# Patient Record
Sex: Male | Born: 1960
Health system: Southern US, Community
[De-identification: ages and names within clinical notes are randomized; demographics above are authoritative.]

## PROBLEM LIST (undated history)

## (undated) DIAGNOSIS — M1990 Unspecified osteoarthritis, unspecified site: Secondary | ICD-10-CM

## (undated) DIAGNOSIS — F419 Anxiety disorder, unspecified: Secondary | ICD-10-CM

## (undated) DIAGNOSIS — T7840XA Allergy, unspecified, initial encounter: Secondary | ICD-10-CM

## (undated) DIAGNOSIS — K219 Gastro-esophageal reflux disease without esophagitis: Secondary | ICD-10-CM

## (undated) DIAGNOSIS — E785 Hyperlipidemia, unspecified: Secondary | ICD-10-CM

## (undated) HISTORY — DX: Unspecified osteoarthritis, unspecified site: M19.90

## (undated) HISTORY — DX: Anxiety disorder, unspecified: F41.9

## (undated) HISTORY — DX: Hyperlipidemia, unspecified: E78.5

## (undated) HISTORY — DX: Gastro-esophageal reflux disease without esophagitis: K21.9

## (undated) HISTORY — PX: POLYPECTOMY: SHX149

## (undated) HISTORY — DX: Allergy, unspecified, initial encounter: T78.40XA

---

## 2000-11-11 HISTORY — PX: COLONOSCOPY: SHX174

## 2004-09-21 ENCOUNTER — Ambulatory Visit: Payer: Self-pay | Admitting: Internal Medicine

## 2004-10-25 ENCOUNTER — Ambulatory Visit: Payer: Self-pay | Admitting: Internal Medicine

## 2004-11-01 ENCOUNTER — Ambulatory Visit: Payer: Self-pay | Admitting: Internal Medicine

## 2005-06-18 ENCOUNTER — Ambulatory Visit: Payer: Self-pay | Admitting: Internal Medicine

## 2005-08-08 ENCOUNTER — Ambulatory Visit: Payer: Self-pay | Admitting: Internal Medicine

## 2005-09-06 ENCOUNTER — Ambulatory Visit: Payer: Self-pay | Admitting: Internal Medicine

## 2005-11-23 ENCOUNTER — Emergency Department (HOSPITAL_COMMUNITY): Admission: EM | Admit: 2005-11-23 | Discharge: 2005-11-23 | Payer: Self-pay | Admitting: Emergency Medicine

## 2005-12-02 ENCOUNTER — Encounter (INDEPENDENT_AMBULATORY_CARE_PROVIDER_SITE_OTHER): Payer: Self-pay | Admitting: *Deleted

## 2005-12-02 ENCOUNTER — Ambulatory Visit: Payer: Self-pay | Admitting: Internal Medicine

## 2005-12-26 ENCOUNTER — Ambulatory Visit: Payer: Self-pay | Admitting: Internal Medicine

## 2006-08-18 ENCOUNTER — Ambulatory Visit: Payer: Self-pay | Admitting: Internal Medicine

## 2006-12-03 ENCOUNTER — Ambulatory Visit: Payer: Self-pay | Admitting: Internal Medicine

## 2006-12-03 ENCOUNTER — Encounter (INDEPENDENT_AMBULATORY_CARE_PROVIDER_SITE_OTHER): Payer: Self-pay | Admitting: *Deleted

## 2006-12-03 LAB — CONVERTED CEMR LAB
Alkaline Phosphatase: 49 units/L (ref 39–117)
BUN: 18 mg/dL (ref 6–23)
Basophils Relative: 0.6 % (ref 0.0–1.0)
CO2: 31 meq/L (ref 19–32)
Calcium: 9.2 mg/dL (ref 8.4–10.5)
Cholesterol: 146 mg/dL (ref 0–200)
HDL: 46.1 mg/dL (ref >39.0)
Hemoglobin: 14.9 g/dL (ref 13.0–17.0)
Lymphocytes Relative: 25.8 % (ref 12.0–46.0)
Monocytes Relative: 10.1 % (ref 3.0–11.0)
Neutrophils Relative %: 54.7 % (ref 43.0–77.0)
PSA: 0.86 ng/mL (ref 0.10–4.00)
Potassium: 4 meq/L (ref 3.5–5.1)
TSH: 2.8 microintl units/mL (ref 0.35–5.50)
Total CHOL/HDL Ratio: 3.2
Total Protein: 6.5 g/dL (ref 6.0–8.3)
WBC: 4.1 10*3/uL — ABNORMAL LOW (ref 4.5–10.5)

## 2006-12-11 DIAGNOSIS — IMO0002 Reserved for concepts with insufficient information to code with codable children: Secondary | ICD-10-CM | POA: Insufficient documentation

## 2007-03-05 ENCOUNTER — Ambulatory Visit: Payer: Self-pay | Admitting: Internal Medicine

## 2007-03-05 LAB — CONVERTED CEMR LAB
Cholesterol: 157 mg/dL (ref 0–200)
HDL: 48.5 mg/dL (ref >39.0)
Total CHOL/HDL Ratio: 3.2
Triglycerides: 47 mg/dL (ref 0–149)

## 2007-03-25 ENCOUNTER — Ambulatory Visit: Payer: Self-pay | Admitting: Internal Medicine

## 2007-03-25 LAB — CONVERTED CEMR LAB: Hgb A1c MFr Bld: 5.5 %

## 2007-09-08 ENCOUNTER — Ambulatory Visit: Payer: Self-pay | Admitting: Internal Medicine

## 2007-11-09 ENCOUNTER — Telehealth (INDEPENDENT_AMBULATORY_CARE_PROVIDER_SITE_OTHER): Payer: Self-pay | Admitting: *Deleted

## 2008-03-15 ENCOUNTER — Encounter (INDEPENDENT_AMBULATORY_CARE_PROVIDER_SITE_OTHER): Payer: Self-pay | Admitting: *Deleted

## 2008-03-18 ENCOUNTER — Ambulatory Visit: Payer: Self-pay | Admitting: Internal Medicine

## 2008-03-18 DIAGNOSIS — I7300 Raynaud's syndrome without gangrene: Secondary | ICD-10-CM | POA: Insufficient documentation

## 2008-03-18 DIAGNOSIS — N400 Enlarged prostate without lower urinary tract symptoms: Secondary | ICD-10-CM | POA: Insufficient documentation

## 2008-07-29 ENCOUNTER — Ambulatory Visit: Payer: Self-pay | Admitting: Family Medicine

## 2008-12-28 ENCOUNTER — Encounter: Payer: Self-pay | Admitting: Internal Medicine

## 2009-03-21 ENCOUNTER — Ambulatory Visit: Payer: Self-pay | Admitting: Internal Medicine

## 2009-03-21 DIAGNOSIS — E7800 Pure hypercholesterolemia, unspecified: Secondary | ICD-10-CM | POA: Insufficient documentation

## 2009-03-21 DIAGNOSIS — E785 Hyperlipidemia, unspecified: Secondary | ICD-10-CM | POA: Insufficient documentation

## 2009-03-22 ENCOUNTER — Encounter (INDEPENDENT_AMBULATORY_CARE_PROVIDER_SITE_OTHER): Payer: Self-pay | Admitting: *Deleted

## 2009-05-26 ENCOUNTER — Telehealth: Payer: Self-pay | Admitting: Internal Medicine

## 2009-06-21 ENCOUNTER — Ambulatory Visit: Payer: Self-pay | Admitting: Internal Medicine

## 2009-06-21 LAB — CONVERTED CEMR LAB
Albumin: 3.9 g/dL (ref 3.5–5.2)
HDL: 41 mg/dL (ref >39.00)
LDL Cholesterol: 70 mg/dL (ref 0–99)
Total Bilirubin: 1.2 mg/dL (ref 0.3–1.2)
Total CHOL/HDL Ratio: 3
VLDL: 7.2 mg/dL (ref 0.0–40.0)

## 2009-06-22 ENCOUNTER — Encounter (INDEPENDENT_AMBULATORY_CARE_PROVIDER_SITE_OTHER): Payer: Self-pay | Admitting: *Deleted

## 2009-08-23 ENCOUNTER — Encounter (INDEPENDENT_AMBULATORY_CARE_PROVIDER_SITE_OTHER): Payer: Self-pay | Admitting: *Deleted

## 2009-09-04 ENCOUNTER — Telehealth (INDEPENDENT_AMBULATORY_CARE_PROVIDER_SITE_OTHER): Payer: Self-pay | Admitting: *Deleted

## 2009-09-14 ENCOUNTER — Telehealth (INDEPENDENT_AMBULATORY_CARE_PROVIDER_SITE_OTHER): Payer: Self-pay | Admitting: *Deleted

## 2009-09-15 ENCOUNTER — Ambulatory Visit: Payer: Self-pay | Admitting: Internal Medicine

## 2009-09-15 ENCOUNTER — Telehealth (INDEPENDENT_AMBULATORY_CARE_PROVIDER_SITE_OTHER): Payer: Self-pay | Admitting: *Deleted

## 2009-09-15 DIAGNOSIS — K219 Gastro-esophageal reflux disease without esophagitis: Secondary | ICD-10-CM | POA: Insufficient documentation

## 2009-09-18 ENCOUNTER — Telehealth (INDEPENDENT_AMBULATORY_CARE_PROVIDER_SITE_OTHER): Payer: Self-pay | Admitting: *Deleted

## 2009-09-21 ENCOUNTER — Encounter: Payer: Self-pay | Admitting: Internal Medicine

## 2009-10-25 ENCOUNTER — Ambulatory Visit: Payer: Self-pay | Admitting: Internal Medicine

## 2009-10-31 ENCOUNTER — Ambulatory Visit: Payer: Self-pay | Admitting: Internal Medicine

## 2009-11-01 ENCOUNTER — Encounter (INDEPENDENT_AMBULATORY_CARE_PROVIDER_SITE_OTHER): Payer: Self-pay | Admitting: *Deleted

## 2009-11-01 LAB — CONVERTED CEMR LAB
Basophils Relative: 0.2 % (ref 0.0–3.0)
Eosinophils Relative: 10.5 % — ABNORMAL HIGH (ref 0.0–5.0)
HCT: 41 % (ref 39.0–52.0)
Hemoglobin: 14.1 g/dL (ref 13.0–17.0)
Lymphs Abs: 1.1 10*3/uL (ref 0.7–4.0)
MCV: 101.8 fL — ABNORMAL HIGH (ref 78.0–100.0)
Monocytes Absolute: 0.4 10*3/uL (ref 0.1–1.0)
Monocytes Relative: 10.8 % (ref 3.0–12.0)
RBC: 4.03 M/uL — ABNORMAL LOW (ref 4.22–5.81)
WBC: 4.1 10*3/uL — ABNORMAL LOW (ref 4.5–10.5)

## 2009-11-13 ENCOUNTER — Telehealth: Payer: Self-pay | Admitting: Internal Medicine

## 2009-12-20 ENCOUNTER — Ambulatory Visit: Payer: Self-pay | Admitting: Internal Medicine

## 2009-12-25 LAB — CONVERTED CEMR LAB
Basophils Relative: 0.2 % (ref 0.0–3.0)
Eosinophils Absolute: 0.4 10*3/uL (ref 0.0–0.7)
Eosinophils Relative: 9.5 % — ABNORMAL HIGH (ref 0.0–5.0)
Hemoglobin: 14.5 g/dL (ref 13.0–17.0)
Lymphocytes Relative: 22.5 % (ref 12.0–46.0)
MCHC: 33.3 g/dL (ref 30.0–36.0)
MCV: 100.9 fL — ABNORMAL HIGH (ref 78.0–100.0)
Neutro Abs: 2.3 10*3/uL (ref 1.4–7.7)
Neutrophils Relative %: 57.3 % (ref 43.0–77.0)
RBC: 4.32 M/uL (ref 4.22–5.81)
WBC: 4 10*3/uL — ABNORMAL LOW (ref 4.5–10.5)

## 2010-01-12 ENCOUNTER — Ambulatory Visit: Payer: Self-pay | Admitting: Internal Medicine

## 2010-01-25 LAB — CONVERTED CEMR LAB
Folate: >20 ng/mL
Iron: 118 ug/dL (ref 42–165)
Saturation Ratios: 34 % (ref 20.0–50.0)

## 2010-01-31 ENCOUNTER — Ambulatory Visit: Payer: Self-pay | Admitting: Internal Medicine

## 2010-01-31 DIAGNOSIS — R42 Dizziness and giddiness: Secondary | ICD-10-CM | POA: Insufficient documentation

## 2010-02-27 ENCOUNTER — Ambulatory Visit: Payer: Self-pay | Admitting: Internal Medicine

## 2010-03-01 LAB — CONVERTED CEMR LAB
ALT: 21 U/L
AST: 25 U/L
Albumin: 4.1 g/dL
Alkaline Phosphatase: 57 U/L
BUN: 18 mg/dL
Bilirubin, Direct: 0 mg/dL
CO2: 31 meq/L
Calcium: 9.1 mg/dL
Chloride: 105 meq/L
Cholesterol: 126 mg/dL
Creatinine, Ser: 1 mg/dL
Free T4: 0.9 ng/dL
GFR calc non Af Amer: 83.75 mL/min
Glucose, Bld: 93 mg/dL
HDL: 47.5 mg/dL
LDL Cholesterol: 70 mg/dL
Potassium: 4.2 meq/L
Sodium: 143 meq/L
TSH: 2.93 u[IU]/mL
Total Bilirubin: 0.6 mg/dL
Total CHOL/HDL Ratio: 3
Total CK: 107 U/L
Total Protein: 6.8 g/dL
Triglycerides: 44 mg/dL
VLDL: 8.8 mg/dL

## 2010-03-29 ENCOUNTER — Telehealth: Payer: Self-pay | Admitting: Internal Medicine

## 2010-09-19 ENCOUNTER — Telehealth: Payer: Self-pay | Admitting: Internal Medicine

## 2010-12-04 ENCOUNTER — Ambulatory Visit
Admission: RE | Admit: 2010-12-04 | Discharge: 2010-12-04 | Payer: Self-pay | Source: Home / Self Care | Attending: Internal Medicine | Admitting: Internal Medicine

## 2010-12-04 ENCOUNTER — Telehealth (INDEPENDENT_AMBULATORY_CARE_PROVIDER_SITE_OTHER): Payer: Self-pay | Admitting: *Deleted

## 2010-12-09 LAB — CONVERTED CEMR LAB
ALT: 23 units/L (ref 0–53)
Albumin: 4.4 g/dL (ref 3.5–5.2)
BUN: 25 mg/dL — ABNORMAL HIGH (ref 6–23)
Basophils Absolute: 0 10*3/uL (ref 0.0–0.1)
Basophils Relative: 0.3 % (ref 0.0–1.0)
CO2: 31 meq/L (ref 19–32)
Calcium: 9.5 mg/dL (ref 8.4–10.5)
Cholesterol, target level: 200 mg/dL
Cholesterol: 172 mg/dL (ref 0–200)
Creatinine, Ser: 1.1 mg/dL (ref 0.4–1.5)
Eosinophils Relative: 9.1 % — ABNORMAL HIGH (ref 0.0–5.0)
HDL goal, serum: 40 mg/dL
HDL goal, serum: 40 mg/dL
Hemoglobin: 16.1 g/dL (ref 13.0–17.0)
LDL Cholesterol: 118 mg/dL — ABNORMAL HIGH (ref 0–99)
Lymphocytes Relative: 21.8 % (ref 12.0–46.0)
MCHC: 33.3 g/dL (ref 30.0–36.0)
MCV: 99.4 fL (ref 78.0–100.0)
Neutro Abs: 2.6 10*3/uL (ref 1.4–7.7)
PSA: 0.73 ng/mL (ref 0.10–4.00)
RBC: 4.88 M/uL (ref 4.22–5.81)
TSH: 2.18 microintl units/mL (ref 0.35–5.50)
Total Bilirubin: 1.2 mg/dL (ref 0.3–1.2)
Total Protein: 7.2 g/dL (ref 6.0–8.3)

## 2010-12-12 NOTE — Assessment & Plan Note (Signed)
Summary: fu on labs/kdc   Vital Signs:  Patient profile:   50 year old male Weight:      156.2 pounds Pulse rate:   76 / minute Resp:     14 per minute BP sitting:   122 / 70  Vitals Entered By: Shonna Chock (January 31, 2010 8:03 AM) CC: Follow-up visit: Discuss labs , Fatigue Comments REVIEWED MED LIST, PATIENT AGREED DOSE AND INSTRUCTION CORRECT    CC:  Follow-up visit: Discuss labs  and Fatigue.  History of Present Illness: He has had fatigue by end of the day , slow progression over 2 years. He has persistant myalgias  only after upper & lower body exercises, soreness lasts  for several days. He questions age vs med effects. Labs reviewed : normal B12, folate, & iron levels. The patient reports primarily physical fatigue, but denies persistent fatigue.  The patient also reports exertional chest pain when "rushing @ work, but not @ gym"  and  new , seasonal cough X several weeks.No PMH of asthma. The patient denies fever,persistent or recurrent  night sweats, weight loss, dyspnea, hemoptysis,or  new medications.  The patient denies the following symptoms: leg swelling, orthopnea, PND, melena, adenopathy, severe snoring, daytime sleepiness, and skin changes.  The patient denies anhedonia, feeling depressed, altered appetite, and poor sleep.  Labs reviewed: WBC low with slight decrease in plat # & increased eosinophil #. PMH of polyp in 2002; due for F/U this year.  Allergies: 1)  ! Pcn 2)  ! Erythromycin 3)  ! Sulfa  Review of Systems General:  Denies chills. Eyes:  Denies double vision and vision loss-both eyes. ENT:  Denies difficulty swallowing and hoarseness. CV:  Denies palpitations; Occasional mild visual changes when standing frfom squat. GI:  Denies constipation and diarrhea. Derm:  Denies changes in nail beds and hair loss. Neuro:  Denies disturbances in coordination, numbness, poor balance, sensation of room spinning, and tingling; Sensation of dizziness in rooms with   high ceiling & when rushing @ work. Endo:  Denies cold intolerance and heat intolerance. Allergy:  Complains of seasonal allergies and sneezing; denies itching eyes; Sneezing last week; Loratidine helped last year. Marland Kitchen  Physical Exam  General:  Thin,in no acute distress; alert,appropriate and cooperative throughout examination Ears:  External ear exam shows no significant lesions or deformities.  Otoscopic examination reveals clear canals, tympanic membranes are intact bilaterally without bulging, retraction, inflammation or discharge. Hearing is grossly normal bilaterally. Nose:  External nasal examination shows no deformity or inflammation. Nasal mucosa are pink and moist without lesions or exudates.Septum to R Mouth:  Oral mucosa and oropharynx without lesions or exudates.  Teeth in good repair. Neck:  No deformities, masses, or tenderness noted. Lungs:  Normal respiratory effort, chest expands symmetrically. Lungs are clear to auscultation, no crackles or wheezes. Heart:  regular rhythm, no murmur, no gallop, no rub, no JVD, and bradycardia.   Abdomen:  Bowel sounds positive,abdomen soft and non-tender without masses, organomegaly or hernias noted. Pulses:  R and L carotid,radial,dorsalis pedis and posterior tibial pulses are full and equal bilaterally Extremities:  No clubbing, cyanosis, edema, or deformity. Neurologic:  alert & oriented X3, gait normal, DTRs symmetrical and normal, finger-to-nose normal, and Romberg negative.   Cervical Nodes:  No lymphadenopathy noted Axillary Nodes:  No palpable lymphadenopathy Psych:  memory intact for recent and remote, normally interactive, and good eye contact.     Impression & Recommendations:  Problem # 1:  FATIGUE (ICD-780.79)  Problem #  2:  MYALGIA (ICD-729.1)  Problem # 3:  DIZZINESS (ICD-780.4) see history for context  Problem # 4:  HYPERLIPIDEMIA (ICD-272.4)  His updated medication list for this problem includes:    Crestor 10 Mg  Tabs (Rosuvastatin calcium) .Marland Kitchen... 1 pill every weds  Problem # 5:  COLONIC POLYPS, HX OF (ICD-V12.72)  Complete Medication List: 1)  Crestor 10 Mg Tabs (Rosuvastatin calcium) .Marland Kitchen.. 1 pill every weds 2)  Multivitamins Tabs (Multiple vitamin) .Marland Kitchen.. 1 by mouth once daily 3)  Vitamin C 250 Mg Tabs (Ascorbic acid) .Marland Kitchen.. 1 by mouth once daily 4)  Echinacea 125 Mg Caps (Echinacea) .Marland Kitchen.. 1 by mouth as needed 5)  Saw Palmetto  6)  Fish Oil  7)  Asa 81mg  Qd  8)  Clidinium-chlordiazepoxide 2.5-5 Mg Caps (Clidinium-chlordiazepoxide) .Marland Kitchen.. 1 q6 hrs as needed gas 9)  Tessalon Perles 100 Mg Caps (Benzonatate) .... Take q 8 hrs as needed cough 10)  Singulair 10 Mg Tabs (Montelukast sodium) .Marland Kitchen.. 1 once daily  Patient Instructions: 1)  Isometric exercises  pre standing & over the calf support hose.Schedule fasting labs: 2)  Schedule a colonoscopy  to help detect colon cancer. 3)  BMP ; 4)  Hepatic Panel ; 5)  Lipid Panel prior to visit, ICD-9: 6)  TSH ; free T4; vitamin D level; CPK. Trial of Singulair 10 mg once daily for allergic symptoms. Prescriptions: SINGULAIR 10 MG TABS (MONTELUKAST SODIUM) 1 once daily  #30 x 11   Entered and Authorized by:   Marga Melnick MD   Signed by:   Marga Melnick MD on 01/31/2010   Method used:   Print then Give to Patient   RxID:   [id number]

## 2010-12-12 NOTE — Progress Notes (Signed)
Summary: BP reading  Phone Note Call from Patient Call back at Home Phone [phone]   Summary of Call: Patient left message on triage that his employer has set up BP reading stations. The patient took his BP today one hour after eating and after running up 2 flights of stairs. He notes that it was 131/68 and would like to know if that is pre-hypertensive.  I spoke with the patient and made him aware that is a good BP, and to find out if he is pre-hypertensive to check his BP two times a day for a week or two and come in for appt to discuss. Patient expressed understanding. Initial call taken by: Lucious Groves CMA,  September 19, 2010 3:18 PM

## 2010-12-12 NOTE — Progress Notes (Signed)
Summary: Schedule Colonoscopy   Phone Note Outgoing Call Call back at Home Phone [phone]   Call placed by: Harlow Mares CMA Duncan Dull),  Mar 29, 2010 1:45 PM Call placed to: Patient Summary of Call: Left message on patients machine to call back. patient is due for colonoscopy. Initial call taken by: Harlow Mares CMA Duncan Dull),  Mar 29, 2010 1:46 PM  Follow-up for Phone Call        left message with patients wife she will have him call back to schedule his colonoscopy Follow-up by: Harlow Mares CMA Duncan Dull),  April 23, 2010 12:34 PM

## 2010-12-12 NOTE — Progress Notes (Signed)
Summary: Triage Call  Phone Note Call from Patient Call back at [phone]   Caller: Patient Summary of Call: 1.) Earlier in December: Cough and drainage(greenish/yellowish) @ times small amount of blood "cold symptoms". Since then cough and drainage comes back(off/on). Patient was offered appointment and refused, preferred Dr.Josanna Hefel review message first and give recommendations  2.) Stopped all supplements due to brusing under right eye, patient stopped as of 10/31/2009. ? if he can now begin to take supplements  [facility]  Chrae Malloy  November 13, 2009 11:50 AM   Follow-up for Phone Call        Neti pot once daily as needed nasal congestion. Generic Tessalon perles 100 mg q 8 hrs as needed cough #15. OVINB. Can resume supplements Follow-up by: Marga Melnick MD,  November 13, 2009 4:53 PM  Additional Follow-up for Phone Call Additional follow up Details #1::        left pt detail message of dr Damyia Strider advisement, rx sent to pharmacy, and to call with any concerns...................Marland KitchenFelecia Deloach CMA  November 13, 2009 5:17 PM     New/Updated Medications: TESSALON PERLES 100 MG CAPS (BENZONATATE) Take q 8 hrs as needed cough Prescriptions: TESSALON PERLES 100 MG CAPS (BENZONATATE) Take q 8 hrs as needed cough  #15 x 0   Entered by:   Jeremy Johann CMA   Authorized by:   Marga Melnick MD   Signed by:   Jeremy Johann CMA on 11/13/2009   Method used:   [facility]ed to ...       [facility] [phone][facility] (retail)       [address]       [place], [place]  [address]       Ph: [phone] or [phone]       Fax: [phone]   RxID:   [id number]

## 2010-12-13 NOTE — Progress Notes (Signed)
Summary: need diag codes for labs = 2/23--added codes  Phone Note Call from Patient   Caller: Patient Summary of Call: scheduled labs 3 weeks before CPX per info on office visit dated 1/24----I have lab orders, but no diag codes for labs = 01-04-23    thanks Initial call taken by: Jerolyn Shin,  December 04, 2010 9:54 AM  Follow-up for Phone Call        V70.0/272.4/995.20 Follow-up by: Shonna Chock CMA,  December 04, 2010 10:04 AM  Additional Follow-up for Phone Call Additional follow up Details #1::        added codes to Epic lab on 2023-01-04.Jerolyn Shin  December 05, 2010 9:42 AM

## 2010-12-13 NOTE — Assessment & Plan Note (Signed)
Summary: for a cold ,sinus//ph   Vital Signs:  Patient profile:   50 year old male Weight:      155.4 pounds BMI:     21.75 Temp:     98.5 degrees F oral Pulse rate:   80 / minute Resp:     13 per minute BP sitting:   118 / 80  (left arm) Cuff size:   large  Vitals Entered By: Shonna Chock CMA (December 04, 2010 9:12 AM) CC: Cold and sinuses since Friday, URI symptoms   CC:  Cold and sinuses since Friday and URI symptoms.  History of Present Illness:    Onset 01/20 as ST , but some yellow secretions from head  over prior 2 weeks . The patient now  reports nasal congestion  and productive cough with scant yellow, but denies purulent nasal discharge and earache.R ear fullness.  The patient denies fever, dyspnea, and wheezing.  The patient denies  frontal headache, bilateral facial pain, tooth pain, and tender adenopathy.  Rx: NSAIDS, Tylenol Allergy Sinus  Current Medications (verified): 1)  Crestor 10 Mg  Tabs (Rosuvastatin Calcium) .Marland Kitchen.. 1 Pill Every Weds 2)  Multivitamins   Tabs (Multiple Vitamin) .Marland Kitchen.. 1 By Mouth Once Daily 3)  Vitamin C 250 Mg  Tabs (Ascorbic Acid) .Marland Kitchen.. 1 By Mouth Once Daily 4)  Echinacea 125 Mg  Caps (Echinacea) .Marland Kitchen.. 1 By Mouth As Needed 5)  Saw Palmetto 6)  Fish Oil 7)  Vitamin D 1000 Unit Tabs (Cholecalciferol) .Marland Kitchen.. 1 By Mouth Once Daily  Allergies: 1)  ! Pcn 2)  ! Erythromycin 3)  ! Sulfa  Physical Exam  General:  Thin but well-nourished,in no acute distress; alert,appropriate and cooperative throughout examination Ears:  L ear normal and R cerumen impaction.   Nose:  External nasal examination shows no deformity or inflammation. Nasal mucosa are  dry without lesions or exudates. Septal dislocation & deviation Mouth:  Oral mucosa and oropharynx without lesions or exudates.  Teeth in good repair. Lungs:  Normal respiratory effort, chest expands symmetrically. Lungs are clear to auscultation, no crackles or wheezes. Heart:  Normal rate and regular  rhythm. S1 and S2 normal without gallop, murmur, click, rub .S4 Skin:  Slightly diaphoretic Cervical Nodes:  No lymphadenopathy noted Axillary Nodes:  No palpable lymphadenopathy   Impression & Recommendations:  Problem # 1:  SINUSITIS- ACUTE-NOS (ICD-461.9)  The following medications were removed from the medication list:    Tessalon Perles 100 Mg Caps (Benzonatate) .Marland Kitchen... Take q 8 hrs as needed cough His updated medication list for this problem includes:    Doxycycline Hyclate 100 Mg Caps (Doxycycline hyclate) .Marland Kitchen... 1 two times a day ; avoid direct sun    Fluticasone Propionate 50 Mcg/act Susp (Fluticasone propionate) .Marland Kitchen... 1 spray two times a day as needed  Complete Medication List: 1)  Crestor 10 Mg Tabs (Rosuvastatin calcium) .Marland Kitchen.. 1 pill every weds 2)  Multivitamins Tabs (Multiple vitamin) .Marland Kitchen.. 1 by mouth once daily 3)  Vitamin C 250 Mg Tabs (Ascorbic acid) .Marland Kitchen.. 1 by mouth once daily 4)  Echinacea 125 Mg Caps (Echinacea) .Marland Kitchen.. 1 by mouth as needed 5)  Saw Palmetto  6)  Fish Oil  7)  Vitamin D 1000 Unit Tabs (Cholecalciferol) .Marland Kitchen.. 1 by mouth once daily 8)  Doxycycline Hyclate 100 Mg Caps (Doxycycline hyclate) .Marland Kitchen.. 1 two times a day ; avoid direct sun 9)  Fluticasone Propionate 50 Mcg/act Susp (Fluticasone propionate) .Marland Kitchen.. 1 spray two times a day as needed  Patient Instructions: 1)  Neti pot once daily as needed ; nasal spray two times a day as needed. Ear hygiene as discussed. 2)  Drink as much NON dairy  fluid as you can tolerate for the next few days. Schedue fasting labs 7-10 days pre CPX: 3)  BMP ;: 4)  Hepatic Panel; 5)   Boston Heart Lipid Panel ; 6)  TSH ; 7)  CBC w/ Diff; 8)  PSA . Prescriptions: FLUTICASONE PROPIONATE 50 MCG/ACT SUSP (FLUTICASONE PROPIONATE) 1 spray two times a day as needed  #1 x 5   Entered and Authorized by:   Marga Melnick MD   Signed by:   Marga Melnick MD on 12/04/2010   Method used:   [facility]ed to ...       [facility] [phone][facility] (retail)        [address]       [place], [place]  [address]       Ph: [phone] or [phone]       Fax: [phone]   RxID:   [id number] DOXYCYCLINE HYCLATE 100 MG CAPS (DOXYCYCLINE HYCLATE) 1 two times a day ; avoid direct sun  #20 x 0   Entered and Authorized by:   Marga Melnick MD   Signed by:   Marga Melnick MD on 12/04/2010   Method used:   [facility]axed to ...       [facility] [phone]* (retail)       [address]       [place], [place]  [address]       Ph: [phone] or [phone]       Fax: [phone]   RxID:   [id number]    Orders Added: 1)  Est. Patient Level III [299[address]]

## 2011-01-03 ENCOUNTER — Other Ambulatory Visit: Payer: Self-pay

## 2011-01-04 ENCOUNTER — Other Ambulatory Visit: Payer: Self-pay | Admitting: Internal Medicine

## 2011-01-04 ENCOUNTER — Encounter (INDEPENDENT_AMBULATORY_CARE_PROVIDER_SITE_OTHER): Payer: Self-pay | Admitting: *Deleted

## 2011-01-04 ENCOUNTER — Other Ambulatory Visit (INDEPENDENT_AMBULATORY_CARE_PROVIDER_SITE_OTHER): Payer: BC Managed Care – PPO

## 2011-01-04 ENCOUNTER — Encounter: Payer: Self-pay | Admitting: Internal Medicine

## 2011-01-04 DIAGNOSIS — E785 Hyperlipidemia, unspecified: Secondary | ICD-10-CM

## 2011-01-04 DIAGNOSIS — T887XXA Unspecified adverse effect of drug or medicament, initial encounter: Secondary | ICD-10-CM

## 2011-01-04 LAB — CBC WITH DIFFERENTIAL/PLATELET
Basophils Absolute: 0 10*3/uL (ref 0.0–0.1)
Eosinophils Absolute: 0.3 10*3/uL (ref 0.0–0.7)
Hemoglobin: 15 g/dL (ref 13.0–17.0)
Lymphocytes Relative: 30.5 % (ref 12.0–46.0)
Lymphs Abs: 1 10*3/uL (ref 0.7–4.0)
MCHC: 35 g/dL (ref 30.0–36.0)
Neutro Abs: 1.7 10*3/uL (ref 1.4–7.7)
Platelets: 131 10*3/uL — ABNORMAL LOW (ref 150.0–400.0)
RDW: 12.5 % (ref 11.5–14.6)

## 2011-01-04 LAB — HEPATIC FUNCTION PANEL
ALT: 18 U/L (ref 0–53)
AST: 23 U/L (ref 0–37)
Albumin: 4.3 g/dL (ref 3.5–5.2)
Alkaline Phosphatase: 50 U/L (ref 39–117)

## 2011-01-04 LAB — BASIC METABOLIC PANEL
CO2: 31 mEq/L (ref 19–32)
Chloride: 105 mEq/L (ref 96–112)
GFR: 85.43 mL/min (ref >60.00)
Glucose, Bld: 89 mg/dL (ref 70–99)
Potassium: 4.6 mEq/L (ref 3.5–5.1)
Sodium: 141 mEq/L (ref 135–145)

## 2011-01-24 ENCOUNTER — Encounter: Payer: Self-pay | Admitting: Internal Medicine

## 2011-01-24 ENCOUNTER — Encounter (INDEPENDENT_AMBULATORY_CARE_PROVIDER_SITE_OTHER): Payer: Self-pay | Admitting: *Deleted

## 2011-01-24 ENCOUNTER — Encounter (INDEPENDENT_AMBULATORY_CARE_PROVIDER_SITE_OTHER): Payer: BC Managed Care – PPO | Admitting: Internal Medicine

## 2011-01-24 DIAGNOSIS — K5289 Other specified noninfective gastroenteritis and colitis: Secondary | ICD-10-CM | POA: Insufficient documentation

## 2011-01-24 DIAGNOSIS — E785 Hyperlipidemia, unspecified: Secondary | ICD-10-CM

## 2011-01-24 DIAGNOSIS — N400 Enlarged prostate without lower urinary tract symptoms: Secondary | ICD-10-CM

## 2011-01-24 DIAGNOSIS — Z0000 Encounter for general adult medical examination without abnormal findings: Secondary | ICD-10-CM

## 2011-01-29 NOTE — Assessment & Plan Note (Signed)
Summary: CPX/FASTING/KN/PH   Vital Signs:  Patient profile:   50 year old male Height:      71.5 inches Weight:      150.6 pounds BMI:     20.79 Temp:     98.2 degrees F oral Pulse rate:   84 / minute Resp:     14 per minute BP sitting:   128 / 80  (left arm) Cuff size:   large  Vitals Entered By: Shonna Chock CMA (January 24, 2011 8:37 AM) CC: CPX and fasting if any additional labs needed  Comments Patient given info about Shingles vaccine    CC:  CPX and fasting if any additional labs needed .  History of Present Illness:    Mr. Jeremy Ellis) is here for a physical; he occasionally has lightheadedness when rushing  @  work & occasionally upon standing.                                                                                                                  Hyperlipidemia F/U :He  denies muscle aches, GI upset, abdominal pain, flushing, itching, constipation, diarrhea, and fatigue.  The patient denies the following symptoms: chest pain/pressure, exercise intolerance, dypsnea, palpitations, syncope, and pedal edema.  Compliance with medications (by patient report) has been near 100% ( Crestor 10 mg once weekly).  Dietary compliance has been good.  The patient reports exercising 5 X per week.  Adjunctive measures currently used by the patient include fiber and fish oil supplements.  Boston Heart Panel reviewed ; no significant risks present. Lipids @ goal. His insurance can't grasp the concept of once weekly Crestor & has expressed concern about this regimen.  Current Medications (verified): 1)  Crestor 10 Mg  Tabs (Rosuvastatin Calcium) .Marland Kitchen.. 1 Pill Every Weds 2)  Multivitamins   Tabs (Multiple Vitamin) .Marland Kitchen.. 1 By Mouth Once Daily 3)  Vitamin C 250 Mg  Tabs (Ascorbic Acid) .Marland Kitchen.. 1 By Mouth Once Daily 4)  Echinacea 125 Mg  Caps (Echinacea) .Marland Kitchen.. 1 By Mouth As Needed 5)  Saw Palmetto 6)  Fish Oil 7)  Vitamin D 1000 Unit Tabs (Cholecalciferol) .Marland Kitchen.. 1 By Mouth Once Daily 8)   Fluticasone Propionate 50 Mcg/act Susp (Fluticasone Propionate) .Marland Kitchen.. 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 LDL goal = < 85 (ideally < 70) based on NMR Lipoprofile Benign prostatic hypertrophy Ileitis @ Colonoscopy , Dr Marina Goodell 2002  Past Surgical History: Colonoscopy : Ileitis on biopsy, Dr Marina Goodell ( F/U was due 201) 2002, due 2012  Family History: PGM: colon cancer MGF:CE Father: CVA,CHF,MI @ 64,DM Mother: lipids, bladder dysfunction Siblings:  PGF: CAD;MGF: CAD ,MI mid 20s; MGM : DM,Alsheimer's  Social History: Never Smoked Occupation:Project Teacher, adult education Single Alcohol use-yes: redwine 3-4X/ month Regular exercise-yes: CVE  5 X/ week  Review of Systems  The patient denies anorexia, fever, weight loss, weight gain, vision loss, decreased hearing, hoarseness, prolonged cough, headaches, hemoptysis, melena, hematochezia, severe indigestion/heartburn, hematuria, suspicious skin lesions, depression, unusual weight change, abnormal bleeding, enlarged lymph nodes, and angioedema.         Work related  stresses  Physical Exam  General:  Well-developed but thin; alert,appropriate and cooperative throughout examination Head:  Normocephalic and atraumatic without obvious abnormalities. No apparent alopecia  Eyes:  No corneal or conjunctival inflammation noted. Perrla. Funduscopic exam benign, without hemorrhages, exudates or papilledema. Ears:  External ear exam shows no significant lesions or deformities.  Otoscopic examination reveals clear canals, tympanic membranes are intact bilaterally without bulging, retraction, inflammation or discharge. Hearing is grossly normal bilaterally. Nose:  External nasal examination shows no  deformity or inflammation. Nasal mucosa are pink and moist without lesions or exudates. Septal deviation/ dislocation Mouth:  Oral mucosa and oropharynx without lesions or exudates.  Teeth in good repair. Neck:  No deformities, masses, or tenderness noted. Lungs:  Normal respiratory effort, chest expands symmetrically. Lungs are clear to auscultation, no crackles or wheezes. Heart:  Normal rate and regular rhythm. S1 and S2 normal without gallop, murmur, click, rub .S4 Abdomen:  Bowel sounds positive,abdomen soft and non-tender without masses, organomegaly or hernias noted. Scaphoid , well muscled Rectal:  No external abnormalities noted. Normal sphincter tone. No rectal masses or tenderness. Genitalia:  Testes bilaterally descended without nodularity, tenderness or masses. No scrotal masses or lesions. No penis lesions or urethral discharge. Minor inguinal weakness bilaterally  w/o hernias Prostate:  no nodules, no asymmetry, no induration; 1+ enlarged.   Msk:  No deformity or scoliosis noted of thoracic or lumbar spine.   Pulses:  R and L carotid,radial,dorsalis pedis and posterior tibial pulses are full and equal bilaterally Extremities:  No clubbing, cyanosis, edema, or deformity noted with normal full range of motion of all joints.   Neurologic:  alert & oriented X3 and DTRs symmetrical and normal.   Skin:  Intact without suspicious lesions or rashes Cervical Nodes:  No lymphadenopathy noted Axillary Nodes:  No palpable lymphadenopathy Inguinal Nodes:  No significant adenopathy Psych:  memory intact for recent and remote, normally interactive, and good eye contact.     Impression & Recommendations:  Problem # 1:  ROUTINE GENERAL MEDICAL EXAM@HEALTH  CARE FACL (ICD-V70.0)  Orders: EKG w/ Interpretation (93000) Gastroenterology Referral (GI)  Problem # 2:  HYPERLIPIDEMIA (ICD-272.4)  The following medications were removed from the medication list:    Crestor 10 Mg Tabs (Rosuvastatin  calcium) .Marland Kitchen... 1 pill every weds His updated medication list for this problem includes:    Pravastatin Sodium 20 Mg Tabs (Pravastatin sodium) .Marland Kitchen... Daily as directed  Problem # 3:  HYPERPLASIA PROSTATE UNS W/O UR OBST & OTH LUTS (ICD-600.90)  Problem # 4:  ILEITIS (ICD-558.9)  PMH of 2002, Dr Marina Goodell  Orders: Gastroenterology Referral (GI)  Complete Medication List: 1)  Multivitamins Tabs (Multiple vitamin) .Marland Kitchen.. 1 by mouth once daily 2)  Vitamin C 250 Mg Tabs (Ascorbic acid) .Marland Kitchen.. 1 by mouth once daily 3)  Echinacea 125 Mg Caps (Echinacea) .Marland Kitchen.. 1 by mouth as needed 4)  Saw Palmetto  5)  Fish Oil  6)  Vitamin D 1000 Unit Tabs (Cholecalciferol) .Marland Kitchen.. 1 by mouth once daily 7)  Fluticasone Propionate 50 Mcg/act Susp (Fluticasone propionate) .Marland Kitchen.. 1 spray two times a day as needed 8)  Pravastatin Sodium 20 Mg Tabs (Pravastatin sodium) .Marland KitchenMarland KitchenMarland Kitchen  Daily as directed  Patient Instructions: 1)  Isometric exercises pre standing after prolonged sitting 2)  Please schedule a follow-up appointment in 6 months. 3)  Lipid Panel prior to visit, ICD-9: 272.4; V17.3. Call if you want to consider meds as needed for stress Prescriptions: PRAVASTATIN SODIUM 20 MG TABS (PRAVASTATIN SODIUM) daily as directed  #90 x 0   Entered and Authorized by:   Marga Melnick MD   Signed by:   Marga Melnick MD on 01/24/2011   Method used:   Print then Give to Patient   RxID:   [id number]    Orders Added: 1)  Est. Patient 40-64 years [99396] 2)  EKG w/ Interpretation [93000] 3)  Gastroenterology Referral [GI]

## 2011-01-29 NOTE — Letter (Signed)
Summary: Pre Visit Letter Revised  [facility]  [address] [place], [place] [address]   Phone: [phone]  Fax: [phone]        01/24/2011 MRN: [id number] [facility] [address] [place], [place]  [address]  [place]             Procedure Date:  03-01-11           Recall Colon---Dr. Marina Goodell   Welcome to the Gastroenterology Division at [facility].    You are scheduled to see a nurse for your pre-procedure visit on 02-14-11 at 8:30a.m. on the 3rd floor at [facility], [address]  We ask that you try to arrive at our office 15 minutes prior to your appointment time to allow for check-in.  Please take a minute to review the attached form.  If you answer "Yes" to one or more of the questions on the first page, we ask that you call the person listed at your earliest opportunity.  If you answer "No" to all of the questions, please complete the rest of the form and bring it to your appointment.    Your nurse visit will consist of discussing your medical and surgical history, your immediate family medical history, and your medications.   If you are unable to list all of your medications on the form, please bring the medication bottles to your appointment and we will list them.  We will need to be aware of both prescribed and over the counter drugs.  We will need to know exact dosage information as well.    Please be prepared to read and sign documents such as consent forms, a financial agreement, and acknowledgement forms.  If necessary, and with your consent, a friend or relative is welcome to sit-in on the nurse visit with you.  Please bring your insurance card so that we may make a copy of it.  If your insurance requires a referral to see a specialist, please bring your referral form from your primary care physician.  No co-pay is required for this nurse visit.     If you cannot keep your appointment, please call [phone] to cancel or reschedule prior to  your appointment date.  This allows [place] the opportunity to schedule an appointment for another patient in need of care.    Thank you for choosing [facility] for your medical needs.  We appreciate the opportunity to care for you.  Please visit [place] at our website  to learn more about our practice.  Sincerely, The Gastroenterology Division

## 2011-03-01 ENCOUNTER — Other Ambulatory Visit: Payer: BC Managed Care – PPO | Admitting: Internal Medicine

## 2011-03-29 NOTE — Assessment & Plan Note (Signed)
[facility]                                   ON-CALL NOTE   [name], [name]                  MRN:          [id number]  DATE:07/05/2006                            DOB:          Jun 14, 1961    Phone number: [phone].   PRIMARY CARE PHYSICIAN:  Dr. Alwyn Ren and Dr. Milinda Antis, on call.   CHIEF COMPLAINT:  Diarrhea.   The patient states that he has had a watery diarrhea, several bouts today.  He started with a decreased appetite yesterday, felt kind of poorly, tired  and draggy.  He denies fever or chills. He has had some bloating and gas  discomfort but no real abdominal pain. He denies nausea or vomiting. He is  somewhat worried because back in January he die have a bad GI virus and was  sent to the emergency room for fluids, and was worried that would happen  again.   I told him that he may be getting a virus and reviewed his diet and nothing  sounded suspicious for food poisoning.  He is going to take gentle sips of  fluid through the rest of today and tomorrow including water and gator aide,  and watch for signs and symptoms of fever, abdominal pain or general  worsening.  If he fears he may be dehydrated he is to go to the emergency  room or call back. If he begins with severe nausea, vomiting, headache or  fever he will also call back to the ER.  Otherwise follow up in the office  if he is not improved Monday.                                   Marne A. Tower, MD   MAT/MedQ  DD:  07/05/2006  DT:  07/06/2006  Job #:  [id number]   cc:   Titus Dubin. Alwyn Ren, MD, FACP, [facility]

## 2011-04-16 ENCOUNTER — Ambulatory Visit (INDEPENDENT_AMBULATORY_CARE_PROVIDER_SITE_OTHER): Payer: BC Managed Care – PPO | Admitting: Internal Medicine

## 2011-04-16 ENCOUNTER — Encounter: Payer: Self-pay | Admitting: Internal Medicine

## 2011-04-16 DIAGNOSIS — J069 Acute upper respiratory infection, unspecified: Secondary | ICD-10-CM

## 2011-04-16 DIAGNOSIS — J209 Acute bronchitis, unspecified: Secondary | ICD-10-CM

## 2011-04-16 DIAGNOSIS — E785 Hyperlipidemia, unspecified: Secondary | ICD-10-CM

## 2011-04-16 MED ORDER — LEVOFLOXACIN 500 MG PO TABS: 500.0000 mg | ORAL_TABLET | Freq: Every day | ORAL | Status: AC

## 2011-04-16 MED ORDER — HYDROCODONE-HOMATROPINE 5-1.5 MG/5ML PO SYRP: 5.0000 mL | ORAL_SOLUTION | Freq: Four times a day (QID) | ORAL | Status: AC | PRN

## 2011-04-16 NOTE — Patient Instructions (Signed)
Plain Mucinex for thick secretions ;force NON dairy fluids for next 48 hrs.  

## 2011-04-16 NOTE — Assessment & Plan Note (Signed)
LDL goal = < 85.

## 2011-04-16 NOTE — Progress Notes (Signed)
  Subjective:    Patient ID: Jeremy Ellis, male    DOB: 11/06/1961, 50 y.o.   MRN: [id number]  HPI  #1 Dyslipidemia assessment: Prior Advanced Lipid Testing: NMR 2007, LDL goal = < 85.   Family history of premature CAD/ MI: MGF in 54s .  Nutrition: heart healthy .  Exercise: CVE 5 days/ week 45-60 min . Diabetes : A1c 5.3% . HTN: no. Smoking history  : never .   Weight :  stable. ROS: fatigue: no  ; chest pain : no ;claudication: no; palpitations: no; abd pain/bowel changes: no ; myalgias:no;  syncope : no ; memory loss: no;skin changes: no. Lab results reviewed :[place] panel: increased cholesterol absorption but LDL is @ goal w/o Zetia & only on Pravastatin 20 mg 1/2  3X/week .     Review of Systems #2 Respiratory tract infection Onset/symptoms:late May as ST Exposures (illness/enronmental/extrinsic):? Progression of symptoms:now cough Treatments/response: NSAIDS Fever/chills/sweats:no Frontal headache:no Facial pain:no Nasal purulence:yes Sore throat:not now Dental pain:no Lymphadenopathy:no Wheezing/shortness of breath:no Cough/sputum/hemoptysis: white phlegm Past medical history: Seasonal allergies; yes/asthma:no         Objective:   Physical Exam General appearance is of good health and nourishment; no acute distress or increased work of breathing is present.  No  lymphadenopathy about the head, neck, or axilla noted.   Eyes: No conjunctival inflammation or lid edema is present. There is no scleral icterus.  Ears:  External ear exam shows no significant lesions or deformities.  Otoscopic examination reveals clear L  Canal; wax on R Nose:  External nasal examination shows no deformity or inflammation. Nasal mucosa are pink and moist without lesions or exudates. Minimal  septal dislocation .No obstruction to airflow.   Oral exam: Dental hygiene is good; lips and gums are healthy appearing.There is no oropharyngeal erythema or exudate noted.   Neck:  No deformities,   masses, or tenderness noted.   Supple with full range of motion without pain.   Heart:  Slow  regular rhythm. S1 and S2 normal without gallop, murmur, click, rub or other extra sounds.   Lungs:Chest clear to auscultation; no wheezes, rhonchi,rales ,or rubs present.No increased work of breathing.  Short barking cough   Extremities:  No cyanosis, edema, or clubbing  noted    Skin: Warm & dry w/o jaundice or tenting but damp.          Assessment & Plan:  #1 bronchitis #2 URI #3 dyslipidemia Plan: see Orders

## 2011-07-09 ENCOUNTER — Ambulatory Visit (INDEPENDENT_AMBULATORY_CARE_PROVIDER_SITE_OTHER): Payer: BC Managed Care – PPO | Admitting: *Deleted

## 2011-07-09 DIAGNOSIS — Z23 Encounter for immunization: Secondary | ICD-10-CM

## 2011-11-07 ENCOUNTER — Encounter: Payer: Self-pay | Admitting: Internal Medicine

## 2011-11-07 ENCOUNTER — Ambulatory Visit (AMBULATORY_SURGERY_CENTER): Payer: BC Managed Care – PPO | Admitting: *Deleted

## 2011-11-07 VITALS — Ht 71.0 in | Wt 155.2 lb

## 2011-11-07 DIAGNOSIS — Z1211 Encounter for screening for malignant neoplasm of colon: Secondary | ICD-10-CM

## 2011-11-07 MED ORDER — PEG-KCL-NACL-NASULF-NA ASC-C 100 G PO SOLR: ORAL | Status: DC

## 2011-11-15 ENCOUNTER — Other Ambulatory Visit: Payer: BC Managed Care – PPO | Admitting: Internal Medicine

## 2011-11-21 ENCOUNTER — Encounter: Payer: BC Managed Care – PPO | Admitting: Internal Medicine

## 2011-12-20 ENCOUNTER — Ambulatory Visit (AMBULATORY_SURGERY_CENTER): Payer: 59 | Admitting: Internal Medicine

## 2011-12-20 ENCOUNTER — Encounter: Payer: Self-pay | Admitting: Internal Medicine

## 2011-12-20 VITALS — HR 69 | Temp 97.8°F | Resp 18 | Ht 71.0 in | Wt 155.0 lb

## 2011-12-20 DIAGNOSIS — Z1211 Encounter for screening for malignant neoplasm of colon: Secondary | ICD-10-CM

## 2011-12-20 DIAGNOSIS — D126 Benign neoplasm of colon, unspecified: Secondary | ICD-10-CM

## 2011-12-20 HISTORY — PX: OTHER SURGICAL HISTORY: SHX169

## 2011-12-20 MED ORDER — SODIUM CHLORIDE 0.9 % IV SOLN: 500.0000 mL | INTRAVENOUS | Status: DC

## 2011-12-20 NOTE — Op Note (Signed)
[facility] [address] [place], [place]  [address]  COLONOSCOPY PROCEDURE REPORT  PATIENT:  Jeremy Ellis, Jeremy Ellis  MR#:  [id number] BIRTHDATE:  1961/05/06, 50 yrs. old  GENDER:  male ENDOSCOPIST:  Wilhemina Bonito. Eda Keys, MD REF. BY:  Screening / Recall PROCEDURE DATE:  12/20/2011 PROCEDURE:  Colonoscopy with snare polypectomy x 1 ASA CLASS:  Class II INDICATIONS:  colorectal cancer screening, average risk ; NEGATIVE INDEX EXAM 2002 (GP W/ CRC) MEDICATIONS:   MAC sedation, administered by CRNA, propofol (Diprivan) 370 mg IV  DESCRIPTION OF PROCEDURE:   After the risks benefits and alternatives of the procedure were thoroughly explained, informed consent was obtained.  Digital rectal exam was performed and revealed no abnormalities.   The LB 180AL [id number] endoscope was introduced through the anus and advanced to the cecum, which was identified by both the appendix and ileocecal valve, without limitations.  The quality of the prep was excellent, using MoviPrep.  The instrument was then slowly withdrawn as the colon was fully examined. <<PROCEDUREIMAGES>>  FINDINGS: A diminutive polyp was found in the ascending colon and snared without cautery. Retrieval was successful.Otherwise normal colonoscopy without other polyps, masses, vascular ectasias, or inflammatory changes. Retroflexed views in the rectum revealed internal hemorrhoids. The time to cecum = 2:45  minutes. The scope was then withdrawn in 12:55  minutes from the cecum and the procedure completed.  COMPLICATIONS:  None  ENDOSCOPIC IMPRESSION: 1) Diminutive polyp in the ascending colon - removed 2) Otherwise normal colonoscopy 3) Internal hemorrhoids  RECOMMENDATIONS: 1) Repeat colonoscopy in 5 years if polyp adenomatous; otherwise 10 years  ______________________________ Wilhemina Bonito. Eda Keys, MD  CC:  Pecola Lawless, MD;  The Patient  n. eSIGNED:   Wilhemina Bonito. Eda Keys at 12/20/2011 11:02 AM  Marcelline Deist,  [phone]

## 2011-12-20 NOTE — Progress Notes (Signed)
Patient did not experience any of the following events: a burn prior to discharge; a fall within the facility; wrong site/side/patient/procedure/implant event; or a hospital transfer or hospital admission upon discharge from the facility. (G8907) Patient did not have preoperative order for IV antibiotic SSI prophylaxis. (G8918)  

## 2011-12-20 NOTE — Patient Instructions (Signed)
Please refer to blue and green discharge instruction sheets. 

## 2011-12-23 ENCOUNTER — Telehealth: Payer: Self-pay | Admitting: *Deleted

## 2011-12-23 NOTE — Telephone Encounter (Signed)
  Follow up Call-  Call back number 12/20/2011  Post procedure Call Back phone  # [phone], EXT [phone], MOBILE [phone]  Permission to leave phone message Yes     Patient questions:  Do you have a fever, pain , or abdominal swelling? no Pain Score  0 *  Have you tolerated food without any problems? yes  Have you been able to return to your normal activities? yes  Do you have any questions about your discharge instructions: Diet   no Medications  no Follow up visit  no  Do you have questions or concerns about your Care? no  Actions: * If pain score is 4 or above: No action needed, pain <4.

## 2011-12-24 ENCOUNTER — Encounter: Payer: Self-pay | Admitting: Internal Medicine

## 2012-01-23 ENCOUNTER — Telehealth: Payer: Self-pay | Admitting: *Deleted

## 2012-01-23 NOTE — Telephone Encounter (Signed)
Pt states that she is a [employer] employee and can get her labs drawn for free. Pt indicated that she has a upcoming CPX with Dr Alwyn Ren and would like to know what labs she needs to have..Please advise

## 2012-01-23 NOTE — Telephone Encounter (Signed)
fasting Labs @ job OK : BMET,Lipids, hepatic panel, CBC & dif, TSH (Code:V70.0)

## 2012-01-24 NOTE — Telephone Encounter (Signed)
Left message to call office

## 2012-02-10 ENCOUNTER — Telehealth: Payer: Self-pay

## 2012-02-10 NOTE — Telephone Encounter (Signed)
msg from patient stating that he needed labs orders to take to [facility].  Please advise on labs     KP

## 2012-02-10 NOTE — Telephone Encounter (Signed)
Left message to call office

## 2012-02-10 NOTE — Telephone Encounter (Signed)
fasting Labs : BMET,Lipids, hepatic panel, CBC & dif, TSH ( V70.00 @ [facility]

## 2012-02-11 ENCOUNTER — Telehealth: Payer: Self-pay | Admitting: *Deleted

## 2012-02-11 MED ORDER — AMBULATORY NON FORMULARY MEDICATION: Status: DC

## 2012-02-11 NOTE — Telephone Encounter (Signed)
Duplicate phone note. See phone note from yesterday

## 2012-02-11 NOTE — Telephone Encounter (Signed)
Patient left message on voicemail: Patient would like lab order faxed to his home

## 2012-02-11 NOTE — Telephone Encounter (Signed)
Pt left msg on triage vmail- returning call from yesterday. Best contact # [phone]

## 2012-02-11 NOTE — Telephone Encounter (Signed)
Left message (on [phone]) for patient to return call with instructions on how he would like to receive order (Fax, Mail, or pick-up)

## 2012-02-18 NOTE — Telephone Encounter (Signed)
See other telephone encounter.

## 2012-03-06 ENCOUNTER — Telehealth: Payer: Self-pay | Admitting: *Deleted

## 2012-03-06 NOTE — Telephone Encounter (Signed)
Dr.Hopper please advise if you have seen this labs

## 2012-03-06 NOTE — Telephone Encounter (Signed)
Pt states that he has cpx scheduled for 03-09-12 and he had his labs done at [facility] since he is a employee there. Pt would like to make sure that labs did make it to our office. .Please advise have you seen these labs.Marland Kitchen

## 2012-03-06 NOTE — Telephone Encounter (Signed)
Left Pt detail message. 

## 2012-03-06 NOTE — Telephone Encounter (Signed)
Labs here ; we'll review @ appt

## 2012-03-09 ENCOUNTER — Ambulatory Visit (INDEPENDENT_AMBULATORY_CARE_PROVIDER_SITE_OTHER): Payer: 59 | Admitting: Internal Medicine

## 2012-03-09 ENCOUNTER — Encounter: Payer: Self-pay | Admitting: Internal Medicine

## 2012-03-09 VITALS — BP 112/76 | HR 78 | Temp 97.8°F | Resp 12 | Ht 71.5 in | Wt 152.4 lb

## 2012-03-09 DIAGNOSIS — Z0000 Encounter for general adult medical examination without abnormal findings: Secondary | ICD-10-CM

## 2012-03-09 NOTE — Patient Instructions (Addendum)
Preventive Health Care: Exercise at least 30-45 minutes a day,  3-4 days a week.  Eat a low-fat diet with lots of fruits and vegetables, up to 7-9 servings per day.  Consume less than 40 grams of sugar per day from foods & drinks with High Fructose Corn Sugar as #1,2,3 or # 4 on label. Please try to go on My Chart within the next 24 hours to allow me to release the results directly to you.  

## 2012-03-09 NOTE — Progress Notes (Signed)
  Subjective:    Patient ID: Jeremy Ellis, male    DOB: 08-Jan-1961, 51 y.o.   MRN: [id number]  HPI  Mr Fragoso is here for a physical; he denies acute issues .       Review of Systems Patient reports no  vision/ hearing changes,anorexia, weight change, fever ,adenopathy, persistant / recurrent hoarseness, swallowing issues, chest pain,palpitations, edema,persistant / recurrent cough, hemoptysis, dyspnea(rest, exertional, paroxysmal nocturnal), gastrointestinal  bleeding (melena, rectal bleeding), abdominal pain, excessive heart burn, GU symptoms( dysuria, hematuria, pyuria, voiding/incontinence  issues) syncope, focal weakness, memory loss,numbness & tingling, skin/hair/nail changes,depression, anxiety, abnormal bruising/bleeding,or  musculoskeletal symptoms/signs.     Objective:   Physical Exam Gen.: Thin but healthy and well-nourished in appearance. Alert, appropriate and cooperative throughout exam. Head: Normocephalic without obvious abnormalities;  no alopecia  Eyes: No corneal or conjunctival inflammation noted. Pupils equal round reactive to light and accommodation. Fundal exam is benign without hemorrhages, exudate, papilledema. Extraocular motion intact. Vision grossly normal. Ears: External  ear exam reveals no significant lesions or deformities. Canals clear .TMs normal. Hearing is grossly normal bilaterally. Nose: External nasal exam reveals no deformity or inflammation. Nasal mucosa are pink and moist. No lesions or exudates noted. Septum slightly dislocated & deviated Mouth: Oral mucosa and oropharynx reveal no lesions or exudates. Teeth in good repair. Neck: No deformities, masses, or tenderness noted. Range of motion & Thyroid normal. Lungs: Normal respiratory effort; chest expands symmetrically. Lungs are clear to auscultation without rales, wheezes, or increased work of breathing. Heart: Normal rate and rhythm. Normal S1 and S2. No gallop, click, or rub. S4 w/o  murmur. Abdomen: Bowel sounds normal; abdomen soft and nontender. No masses, organomegaly or hernias noted. Genitalia/ DRE: Genital exam unremarkable. Prostate upper limits of normal in size but soft without nodules or induration Musculoskeletal/extremities: No deformity or scoliosis noted of  the thoracic or lumbar spine. No clubbing, cyanosis, edema, or deformity noted. Range of motion  normal .Tone & strength  normal.Joints normal. Nail health  good. Vascular: Carotid, radial artery, dorsalis pedis and  posterior tibial pulses are full and equal. No bruits present. Neurologic: Alert and oriented x3. Deep tendon reflexes symmetrical and normal.          Skin: Intact without suspicious lesions or rashes. Lymph: No cervical, axillary, or inguinal lymphadenopathy present. Psych: Mood and affect are normal. Normally interactive                                                                                         Assessment & Plan:  #1 comprehensive physical exam; no acute findings #2 see Problem List with Assessments & Recommendations Plan: see Orders

## 2012-03-10 LAB — HEMOGLOBIN A1C: Hgb A1c MFr Bld: 5.7 % (ref 4.6–6.5)

## 2012-03-11 ENCOUNTER — Encounter: Payer: Self-pay | Admitting: Internal Medicine

## 2012-04-20 ENCOUNTER — Telehealth: Payer: Self-pay | Admitting: *Deleted

## 2012-04-20 NOTE — Telephone Encounter (Signed)
Pt left VM c/o sinus drainage, cough with greenish/yellow mucous x3weeks. Pt states that he had cold symptoms a few weeks ago which started episode but have now increased to the following symptoms. Left message to call office to advise Pt OV will be needed.

## 2012-04-22 NOTE — Telephone Encounter (Signed)
Pt return call appt. scheduled

## 2012-04-24 ENCOUNTER — Ambulatory Visit: Payer: 59 | Admitting: Internal Medicine

## 2012-05-04 ENCOUNTER — Encounter: Payer: Self-pay | Admitting: Internal Medicine

## 2012-05-04 ENCOUNTER — Ambulatory Visit (INDEPENDENT_AMBULATORY_CARE_PROVIDER_SITE_OTHER): Payer: 59 | Admitting: Internal Medicine

## 2012-05-04 VITALS — BP 102/66 | HR 83 | Temp 98.0°F | Wt 152.6 lb

## 2012-05-04 DIAGNOSIS — J209 Acute bronchitis, unspecified: Secondary | ICD-10-CM

## 2012-05-04 DIAGNOSIS — R198 Other specified symptoms and signs involving the digestive system and abdomen: Secondary | ICD-10-CM

## 2012-05-04 MED ORDER — DOXYCYCLINE HYCLATE 100 MG PO TABS: 100.0000 mg | ORAL_TABLET | Freq: Two times a day (BID) | ORAL | Status: AC

## 2012-05-04 MED ORDER — BENZONATATE 200 MG PO CAPS: 200.0000 mg | ORAL_CAPSULE | Freq: Three times a day (TID) | ORAL | Status: AC | PRN

## 2012-05-04 NOTE — Progress Notes (Signed)
  Subjective:    Patient ID: Jeremy Ellis, male    DOB: [birth year], 51 y.o.   MRN: [id number]  HPI Symptoms began 4&1/2 weeks ago as a sore throat associated with some purulent postnasal drainage. He also had symptoms of fatigue. Over the last 4 weeks he's had intermittent cough which actually improved until last week. It tends to be nonproductive. The symptoms have impacted his ability to exercise. The only treatment to date has been acetaminophen.        Review of Systems  He denies significant frontal headache, facial pain, nasal purulence, dental pain,fever , chills ,sweats, or pleurisy. The symptoms have not been associated extrinsic symptoms such as if she/watery eyes or sneezing. He is not having significant shortness of breath or wheezing. He has had some loose stools; there has been some increased job stress recently. He is not having significant dyspepsia or dysphasia. He is not on ACE inhibitor     Objective:   Physical Exam General appearance : thin,good health ;well nourished; no acute distress or increased work of breathing is present.  No  lymphadenopathy about the head, neck, or axilla noted.   Eyes: No conjunctival inflammation or lid edema is present. There is no scleral icterus.  Ears:  External ear exam shows no significant lesions or deformities.  Otoscopic examination reveals wax on R; L ear normal  Nose:  External nasal examination shows no deformity or inflammation. Nasal mucosa are pink and moist without lesions or exudates. No septal dislocation or deviation.No obstruction to airflow.   Oral exam: Dental hygiene is good; lips and gums are healthy appearing.There is no oropharyngeal erythema or exudate noted.      Heart:  Normal rate and regular rhythm. S1 and S2 normal without gallop, murmur, click, rub .No  extra sounds.   Lungs:Chest clear to auscultation; no wheezes, rhonchi,rales ,or rubs present.No increased work of breathing. Minor, dry   cough  Extremities:  No cyanosis, edema, or clubbing  noted    Skin: Warm & dry w/o jaundice or tenting.          Assessment & Plan:  #1 bronchitis; clinically no bronchospasm present  #2 some bowel changes without frank diarrhea  #3 documented true allergies to erythromycin, penicillin, and sulfa.  Plan: See orders and recommendations

## 2012-05-04 NOTE — Patient Instructions (Addendum)
Please take the probiotic , Align, every day until the bowels are normal. This will replace the normal bacteria which  are necessary for formation of normal stool and processing of food. Avoid direct sun while on antibiotic

## 2012-06-02 ENCOUNTER — Telehealth: Payer: Self-pay | Admitting: Internal Medicine

## 2012-06-02 NOTE — Telephone Encounter (Signed)
Left message on voicemail for patient to return call when available. Patient also instructed he can send questions via MyChart

## 2012-06-04 NOTE — Telephone Encounter (Signed)
Left message on voicemail for patient to return call when available   

## 2012-06-08 NOTE — Telephone Encounter (Signed)
Left message on voicemail for patient to return call. 

## 2012-06-11 NOTE — Telephone Encounter (Signed)
After several attempts to reach patient, patient never returned call.

## 2012-06-17 ENCOUNTER — Telehealth: Payer: Self-pay

## 2012-06-17 NOTE — Telephone Encounter (Signed)
Patient left message on voicemail stating he apologize for just now returning call (very busy at work). Patient states he would like to know if there is any preventative studies he can do to detect hidden heart problems? Patient states he has no symptoms of heart problems, he is just concerned with the family history and his elevated cholesterol levels.  Dr.Hopper please advise

## 2012-06-17 NOTE — Telephone Encounter (Signed)
Left message to call office

## 2012-06-17 NOTE — Telephone Encounter (Signed)
We can order a stress test or coronary artery CT scan for calcium; but without symptoms the managed-care will not pay for these. I be happy to order these if he wants to pursue either

## 2012-06-18 NOTE — Telephone Encounter (Signed)
Left message to call office

## 2012-06-19 NOTE — Telephone Encounter (Signed)
Pt left msg on vm returning call. Called pt & left msg to call the office.

## 2012-06-19 NOTE — Telephone Encounter (Signed)
Spoke with pt & he states that he is going to double check to see if the stress test & CT scan will be covered. If they are then he will call back & let us know.

## 2012-06-24 ENCOUNTER — Telehealth: Payer: Self-pay | Admitting: *Deleted

## 2012-06-24 NOTE — Telephone Encounter (Signed)
Pt left VM wanting to know if he was up to date on hep c vaccine and if not does he need it. .Left message to call office.

## 2012-07-03 NOTE — Telephone Encounter (Signed)
Left message on voicemail informing patient to return call when available  

## 2012-07-06 NOTE — Telephone Encounter (Signed)
Pt return call,Left message to call office.  

## 2012-07-14 ENCOUNTER — Other Ambulatory Visit: Payer: Self-pay | Admitting: *Deleted

## 2012-07-14 MED ORDER — PRAVASTATIN SODIUM 20 MG PO TABS: ORAL_TABLET | ORAL | Status: DC

## 2012-07-14 NOTE — Telephone Encounter (Signed)
Discuss with patient, will call back later to schedule appt.

## 2012-07-14 NOTE — Telephone Encounter (Signed)
Pt called back stating that bee sting are better just some redness and itching at site but no increase pain or swelling noted. Pt also made aware no hep C vaccine. Pt indicated that he thought that he saw this somewhere but is not sure but maybe it was for a screening if you were born within a certain time. Pt advise if he would like to be check for hep C OV will be needed along with labs, Pt ok will call back if he would like to further address info.

## 2012-07-14 NOTE — Telephone Encounter (Signed)
[facility] Triage Call Report Triage Record Num: [id number] Operator: Peri Jefferson Patient Name: Jeremy Ellis Call Date & Time: 07/13/2012 12:19:14PM Patient Phone: [phone] PCP: Marga Melnick Patient Gender: Male PCP Fax : [phone] Patient DOB: 06-11-1961 Practice Name: [facility] Reason for Call: Caller: Stan/Patient; PCP: Marga Melnick; CB#: [phone]; Call regarding Bee Sting. Weyman Croon was stung 7 times by a yellow jacket today. Happened 1 hour ago. Stings are located on his left leg and hip. Denies difficulty breathing/swallowing. Utilized Bites and Stings Guideline. Home care advice given with parameters reviewed concerning when to call back. Protocol(s) Used: Bites and Stings - Insects or Spiders Recommended Outcome per Protocol: Provide Home/Self Care Reason for Outcome: Bee, yellow jacket, hornet, or wasp sting AND no self-care initiated Care Advice: Topical corticosteroid cream/gel may be used as directed on label or by pharmacist. DO NOT apply a dressing to the area. ~ Call EMS 911 if any of the following occur within 24 hours of bite/sting: loss of consciousness, sudden onset of difficulty breathing or wheezing, chest pain or tightness, throat tightness, severe swelling of parts of the body (e.g., eyes, lips, or tongue) other than bite/sting site, abdominal cramps. ~ If, more than 24 hours after the incident, the sting/bite site or area around sting/bite site becomes increasingly swollen, red or painful, or has a purulent or foul smelling discharge, or red streaks develop leading away from the site, call provider immediately. ~ ~ Wash area gently but thoroughly with warm soapy water. Apply cloth-covered ice pack or a cool compress to the area for no more than 20 minutes 4-8 times a day while awake to reduce pain and swelling. ~ Apply a baking soda paste made with 3 parts baking soda and 1 part water to the sting to relieve stinging. Repeat  as necessary. ~ Analgesic/Antipyretic Advice - NSAIDs: Consider aspirin, ibuprofen, naproxen or ketoprofen for pain or fever as directed on label or by pharmacist/provider. PRECAUTIONS: - If over 28 years of age, should not take longer than 1 week without consulting provider. EXCEPTIONS: - Should not be used if taking blood thinners or have bleeding problems. - Do not use if have history of sensitivity/allergy to any of these medications; or history of cardiovascular, ulcer, kidney, liver disease or diabetes unless approved by provider. - Do not exceed recommended dose or frequency.

## 2012-07-14 NOTE — Telephone Encounter (Signed)
Rx sent 

## 2012-07-14 NOTE — Telephone Encounter (Signed)
Left message to call office to see how Pt is doing.

## 2012-07-14 NOTE — Telephone Encounter (Signed)
H is correct; Hepatitis C screening is recommended for baby boomers;  code would be possible exposure to hepatitis

## 2012-07-30 ENCOUNTER — Telehealth: Payer: Self-pay | Admitting: Internal Medicine

## 2012-07-30 NOTE — Telephone Encounter (Signed)
Patient left vm on triage line stating he is due for cholesterol labs, but he had lab work done recently somewhere else. He has an electronic copy of the results and would like to know if he can email or snail mail the results to [place] rather than having these labs drawn again. Call back # [phone]

## 2012-07-31 NOTE — Telephone Encounter (Signed)
Left message on voicemail informing patient ok to mail labs

## 2012-08-26 ENCOUNTER — Telehealth: Payer: Self-pay

## 2012-08-26 NOTE — Telephone Encounter (Signed)
Left message on voicemail with Dr.Hopper's response. Patient to call for appointment

## 2012-08-26 NOTE — Telephone Encounter (Signed)
Psoriasis does not usually appear on the face; I recommend he be seen here or by the dermatologist to verify the diagnosis.

## 2012-08-26 NOTE — Telephone Encounter (Signed)
Pt called left message on triage line stating he has psoriasis on face what ointment should he use? Plz advise   MW

## 2012-10-05 ENCOUNTER — Other Ambulatory Visit: Payer: Self-pay | Admitting: Internal Medicine

## 2012-10-05 DIAGNOSIS — T887XXA Unspecified adverse effect of drug or medicament, initial encounter: Secondary | ICD-10-CM

## 2012-10-05 DIAGNOSIS — E785 Hyperlipidemia, unspecified: Secondary | ICD-10-CM

## 2012-10-05 MED ORDER — PRAVASTATIN SODIUM 20 MG PO TABS: ORAL_TABLET | ORAL | Status: DC

## 2012-10-05 NOTE — Telephone Encounter (Signed)
I left message on VM informing patient he needs to have cholesterol levels checked. 3 month supply to be given with no refills can be given to allow patient time to get in and have labs. Additional refills require documentation of cholesterol levels.  Future orders placed

## 2012-10-05 NOTE — Telephone Encounter (Signed)
LM ON TRIAGE LINE 1218PM  statimng 30-day rx for Pravastatin is getting costly would like rx for 1-yr if possible CB# [phone]

## 2012-10-06 ENCOUNTER — Telehealth: Payer: Self-pay

## 2012-10-06 NOTE — Telephone Encounter (Signed)
Pt LMOVM stating works for [employer] had some lab work done there and can provide so he can get a yr. Supply of pravastatin. Pt requesting CB to discuss how he can get info to you etc. PLz advise pt WU#[phone] MW

## 2012-10-06 NOTE — Telephone Encounter (Signed)
Left message on voicemail informing patient he can fax to [phone], mail or drop labs off. Patient to call if additional questions or concerns

## 2012-10-07 NOTE — Telephone Encounter (Signed)
Pt LMOVM confirming he received your message. Pt stated he will cb and schedule appt. Pt stated he is thankful and confident that you and Hop know what pt needs to do and he will do it.    MW

## 2012-10-21 ENCOUNTER — Other Ambulatory Visit: Payer: Self-pay | Admitting: Internal Medicine

## 2012-10-21 DIAGNOSIS — T887XXA Unspecified adverse effect of drug or medicament, initial encounter: Secondary | ICD-10-CM

## 2012-10-21 DIAGNOSIS — E785 Hyperlipidemia, unspecified: Secondary | ICD-10-CM

## 2012-10-21 NOTE — Telephone Encounter (Signed)
REFILL REQUESTING 90-DAY SUPPLY BY [facility]-- Pravastatin (Tab) 20 MG Take. 1/2 by mouth 3 x weekly --last ov 6.24.13 NOTE pt never cb to schedule labs per last phone note

## 2012-10-21 NOTE — Telephone Encounter (Signed)
#  30 .Please  schedule fasting Labs : Lipids, AST,ALT, TSH. Code: 272.4, 995.20

## 2012-10-21 NOTE — Telephone Encounter (Signed)
Dr.Hopper please advise, last cholesterol check was 12/2010, patient had labs through insurance/employer but it did not include his cholesterol level. I left message informing patient that he needs to come in for labs, no pending appointment

## 2012-10-21 NOTE — Telephone Encounter (Signed)
Called home number, no answer/no answering service. I called cell phone and left message for patient to return call for appointment (Fasting labs), orders placed already

## 2012-10-23 NOTE — Telephone Encounter (Signed)
Noted  

## 2012-10-23 NOTE — Telephone Encounter (Signed)
Pt returned your call. Labs scheduled for 10-28-12

## 2012-10-23 NOTE — Telephone Encounter (Signed)
Left message on voicemail for patient to return call when available   

## 2012-10-28 ENCOUNTER — Other Ambulatory Visit (INDEPENDENT_AMBULATORY_CARE_PROVIDER_SITE_OTHER): Payer: 59

## 2012-10-28 ENCOUNTER — Telehealth: Payer: Self-pay | Admitting: Internal Medicine

## 2012-10-28 DIAGNOSIS — E785 Hyperlipidemia, unspecified: Secondary | ICD-10-CM

## 2012-10-28 DIAGNOSIS — T887XXA Unspecified adverse effect of drug or medicament, initial encounter: Secondary | ICD-10-CM

## 2012-10-28 LAB — HEPATIC FUNCTION PANEL
AST: 23 U/L (ref 0–37)
Albumin: 4.1 g/dL (ref 3.5–5.2)
Alkaline Phosphatase: 56 U/L (ref 39–117)
Bilirubin, Direct: 0.1 mg/dL (ref 0.0–0.3)
Total Protein: 6.9 g/dL (ref 6.0–8.3)

## 2012-10-28 LAB — LIPID PANEL
Cholesterol: 142 mg/dL (ref 0–200)
Triglycerides: 38 mg/dL (ref 0.0–149.0)

## 2012-10-28 NOTE — Telephone Encounter (Signed)
Refill: Pravastatin tab. No additional information listed.

## 2012-10-28 NOTE — Telephone Encounter (Signed)
Dr.Hopper stated in previous phone note, no 90 day supply until labs resulted

## 2012-10-29 MED ORDER — PRAVASTATIN SODIUM 20 MG PO TABS: ORAL_TABLET | ORAL | Status: DC

## 2012-10-29 NOTE — Telephone Encounter (Signed)
Refill #[id number] 

## 2012-10-29 NOTE — Telephone Encounter (Signed)
Hopp please advise on patient's labs (Active with MyChart), I will then know how to address refill

## 2012-12-14 ENCOUNTER — Ambulatory Visit: Payer: 59 | Admitting: Internal Medicine

## 2012-12-15 ENCOUNTER — Encounter: Payer: Self-pay | Admitting: Internal Medicine

## 2012-12-26 ENCOUNTER — Other Ambulatory Visit: Payer: Self-pay

## 2013-03-10 ENCOUNTER — Telehealth: Payer: Self-pay | Admitting: Internal Medicine

## 2013-03-10 NOTE — Telephone Encounter (Signed)
Patient wanting to know should he fast for his physical on 5.1.14 and if so could a [employer] employee draw his blood work ,because he gets his labs free.

## 2013-03-11 ENCOUNTER — Ambulatory Visit (INDEPENDENT_AMBULATORY_CARE_PROVIDER_SITE_OTHER): Payer: 59 | Admitting: Internal Medicine

## 2013-03-11 ENCOUNTER — Encounter: Payer: Self-pay | Admitting: Internal Medicine

## 2013-03-11 VITALS — BP 118/76 | HR 80 | Temp 98.2°F | Resp 12 | Ht 71.0 in | Wt 151.6 lb

## 2013-03-11 DIAGNOSIS — IMO0002 Reserved for concepts with insufficient information to code with codable children: Secondary | ICD-10-CM

## 2013-03-11 DIAGNOSIS — Z0000 Encounter for general adult medical examination without abnormal findings: Secondary | ICD-10-CM

## 2013-03-11 NOTE — Telephone Encounter (Signed)
Sent patient a [employer]ologist, no number on file for patient

## 2013-03-11 NOTE — Progress Notes (Signed)
  Subjective:    Patient ID: Jeremy Ellis, male    DOB: 1961/03/14, 52 y.o.   MRN: [id number]  HPI  Jeremy Ellis is here for a physical;acute issues include intermittent dizziness & chest pain.     Review of Systems He is on a heart healthy diet; he exercises >45-60 minutes 4-5 times per week as cardio & resistence @ high level without symptoms. Specifically he denies chest pain, palpitations, dyspnea, or claudication with exercise. Family history is positive for premature coronary disease in MGF. Advanced cholesterol testing reveals his LDL goal is  less than 85.  The dizziness and vague substernal chest  soreness occur when he's under stress at the office such as rushing to meetings to discuss projects.    Objective:   Physical Exam Gen.: Thin but healthy and well-nourished in appearance. Alert, appropriate and cooperative throughout exam.Appears younger than stated age  Head: Normocephalic without obvious abnormalities Eyes: No corneal or conjunctival inflammation noted.  Extraocular motion intact. Vision grossly normal without lenses Ears: External  ear exam reveals no significant lesions or deformities. Some wax bilaterally. Hearing is grossly normal bilaterally. Nose: External nasal exam reveals no deformity or inflammation. Nasal mucosa are pink and moist. No lesions or exudates noted.  Mouth: Oral mucosa and oropharynx reveal no lesions or exudates. Teeth in good repair. Neck: No deformities, masses, or tenderness noted. Range of motion & thyroid normal. Lungs: Normal respiratory effort; chest expands symmetrically. Lungs are clear to auscultation without rales, wheezes, or increased work of breathing. Heart: Normal rate and rhythm. Normal S1 and S2. No gallop,  or rub.Intermittent click @ apex; no murmur. Abdomen: Bowel sounds normal; abdomen soft and nontender. No masses, organomegaly or hernias noted. Genitalia: Genitalia normal except for left varices. Minor weakness in the inguinal  rings with cough. Prostate symmetrically enlarged 1.5-2 + without asymmetry, nodularity, or induration.                Musculoskeletal/extremities: No deformity or scoliosis noted of  the thoracic or lumbar spine.  No clubbing, cyanosis, edema, or significant extremity  deformity noted. Range of motion normal .Tone & strength  Normal. Joints normal . Nail health good. Able to lie down & sit up w/o help. Negative SLR bilaterally Vascular: Carotid, radial artery, dorsalis pedis and  posterior tibial pulses are full and equal. No bruits present. Neurologic: Alert and oriented x3. Deep tendon reflexes symmetrical and normal.           Skin: Intact without suspicious lesions or rashes. Lymph: No cervical, axillary, or inguinal lymphadenopathy present. Psych: Mood and affect are normal. Normally interactive                                                                                        Assessment & Plan:  #1 comprehensive physical exam  #2 atypical stress-related symptoms. No symptoms at high levels of cardiovascular exercise. Clinically mitral click suggested intermittently.  #3 BPH  Plan: see Orders  & Recommendations

## 2013-03-11 NOTE — Patient Instructions (Addendum)
Please  schedule fasting Labs : BMET,Lipids, hepatic panel, CBC & dif, TSH, PSA.Please  schedule fasting Labs : BMET,Lipids, hepatic panel, CBC & dif, TSH, PSA.PLEASE BRING THESE INSTRUCTIONS TO FOLLOW UP  LAB APPOINTMENT.This will guarantee correct labs are drawn, eliminating need for repeat blood sampling ( needle sticks ! ). Diagnoses /Codes: V70.0.    If you activate the  My Chart system; lab & Xray results will be released directly  to you as soon as I review & address these through the computer. If you choose not to sign up for My Chart within 36 hours of labs being drawn; results will be reviewed & interpretation added before being copied & mailed, causing a delay in getting the results to you.If you do not receive that report within 7-10 days ,please call. Additionally you can use this system to gain direct  access to your records  if  out of town or @ an office of a  physician who is not in  the My Chart network.  This improves continuity of care & places you in control of your medical record.   Go to WebMD for information about mitral valve prolapse/click .

## 2013-03-26 ENCOUNTER — Encounter: Payer: Self-pay | Admitting: Internal Medicine

## 2013-03-28 ENCOUNTER — Encounter: Payer: Self-pay | Admitting: Internal Medicine

## 2013-03-29 ENCOUNTER — Telehealth: Payer: Self-pay

## 2013-03-29 MED ORDER — AMBULATORY NON FORMULARY MEDICATION: Status: DC

## 2013-03-29 NOTE — Telephone Encounter (Signed)
Lab order mailed to patient per patient request.

## 2013-04-06 ENCOUNTER — Encounter: Payer: Self-pay | Admitting: Internal Medicine

## 2013-04-08 ENCOUNTER — Encounter: Payer: Self-pay | Admitting: Internal Medicine

## 2013-05-10 ENCOUNTER — Telehealth: Payer: Self-pay | Admitting: Internal Medicine

## 2013-05-10 DIAGNOSIS — Z0000 Encounter for general adult medical examination without abnormal findings: Secondary | ICD-10-CM

## 2013-05-10 NOTE — Telephone Encounter (Signed)
Lab orders entered

## 2013-05-10 NOTE — Telephone Encounter (Signed)
Please put in patient future lab orders for this Thursday. thanks

## 2013-05-10 NOTE — Telephone Encounter (Signed)
Recommended fasting Labs 03/11/13 & apparently never completed were BMET,Lipids, hepatic panel, CBC & dif, TSH, PSA. Code: V70.0

## 2013-05-10 NOTE — Telephone Encounter (Signed)
Please clarify which lab orders need to be entered.

## 2013-05-13 ENCOUNTER — Other Ambulatory Visit (INDEPENDENT_AMBULATORY_CARE_PROVIDER_SITE_OTHER): Payer: 59

## 2013-05-13 DIAGNOSIS — Z0000 Encounter for general adult medical examination without abnormal findings: Secondary | ICD-10-CM

## 2013-05-13 LAB — BASIC METABOLIC PANEL
CO2: 34 mEq/L — ABNORMAL HIGH (ref 19–32)
Calcium: 9.3 mg/dL (ref 8.4–10.5)
GFR: 85.67 mL/min (ref >60.00)
Sodium: 140 mEq/L (ref 135–145)

## 2013-05-13 LAB — HEPATIC FUNCTION PANEL
ALT: 17 U/L (ref 0–53)
AST: 21 U/L (ref 0–37)
Albumin: 4.1 g/dL (ref 3.5–5.2)
Total Protein: 6.6 g/dL (ref 6.0–8.3)

## 2013-05-13 LAB — LIPID PANEL
Cholesterol: 145 mg/dL (ref 0–200)
HDL: 45.7 mg/dL (ref >39.00)
Triglycerides: 44 mg/dL (ref 0.0–149.0)

## 2013-05-13 LAB — CBC WITH DIFFERENTIAL/PLATELET
Basophils Absolute: 0 10*3/uL (ref 0.0–0.1)
Eosinophils Relative: 11.3 % — ABNORMAL HIGH (ref 0.0–5.0)
Monocytes Relative: 10.7 % (ref 3.0–12.0)
Neutrophils Relative %: 48.8 % (ref 43.0–77.0)
Platelets: 134 10*3/uL — ABNORMAL LOW (ref 150.0–400.0)
RDW: 12.4 % (ref 11.5–14.6)
WBC: 4.1 10*3/uL — ABNORMAL LOW (ref 4.5–10.5)

## 2013-05-13 LAB — PSA: PSA: 0.75 ng/mL (ref 0.10–4.00)

## 2013-05-13 LAB — TSH: TSH: 2.6 u[IU]/mL (ref 0.35–5.50)

## 2013-09-16 ENCOUNTER — Other Ambulatory Visit: Payer: Self-pay

## 2013-09-17 ENCOUNTER — Ambulatory Visit: Payer: 59 | Admitting: Internal Medicine

## 2013-10-18 ENCOUNTER — Encounter: Payer: Self-pay | Admitting: Internal Medicine

## 2013-10-18 ENCOUNTER — Ambulatory Visit (INDEPENDENT_AMBULATORY_CARE_PROVIDER_SITE_OTHER): Payer: 59 | Admitting: Internal Medicine

## 2013-10-18 DIAGNOSIS — J310 Chronic rhinitis: Secondary | ICD-10-CM

## 2013-10-18 DIAGNOSIS — R059 Cough, unspecified: Secondary | ICD-10-CM

## 2013-10-18 DIAGNOSIS — L601 Onycholysis: Secondary | ICD-10-CM

## 2013-10-18 DIAGNOSIS — R05 Cough: Secondary | ICD-10-CM

## 2013-10-18 DIAGNOSIS — L608 Other nail disorders: Secondary | ICD-10-CM

## 2013-10-18 DIAGNOSIS — H6120 Impacted cerumen, unspecified ear: Secondary | ICD-10-CM

## 2013-10-18 DIAGNOSIS — L719 Rosacea, unspecified: Secondary | ICD-10-CM

## 2013-10-18 DIAGNOSIS — H6123 Impacted cerumen, bilateral: Secondary | ICD-10-CM

## 2013-10-18 MED ORDER — METRONIDAZOLE 0.75 % EX GEL: 1.0000 "application " | Freq: Two times a day (BID) | CUTANEOUS | Status: DC

## 2013-10-18 NOTE — Patient Instructions (Signed)
Please do not use Q-tips as we discussed. Should wax build up occur, please put 2-3 drops of mineral oil in the affected  ear at night to soften the wax .Cover the canal with a  cotton ball to prevent the oil from staining bed linens.In the morning fill the ear canal with hydrogen peroxide & lie in the opposite lateral decubitus position(on the side opposite the affected ear)  for 10-15 minutes.After allowing this period of time for the peroxide to dissolve the wax ;shower and use the thinnest washrag available to wick out the wax.If both ears are involved ; alternate this treatment from ear to ear each night until no wax is found on the washrag.  Plain Mucinex (NOT D) for thick secretions ;force NON dairy fluids .   Nasal cleansing in the shower as discussed with lather of mild shampoo.After 10 seconds wash off lather while  exhaling through nostrils. Make sure that all residual soap is removed to prevent irritation.  Nasacort AQ OTC 1 spray in each nostril twice a day as needed. Use the "crossover" technique into opposite nostril spraying toward opposite ear @ 45 degree angle, not straight up into nostril.  Use a Neti pot daily only  as needed for significant sinus congestion; going from open side to congested side . Plain Allegra (NOT D )  160 daily , Loratidine 10 mg , OR Zyrtec 10 mg @ bedtime  as needed for itchy eyes & sneezing.    After showering  use a hairdryer to blow the nails dry. Also do this after any activities which cause sweating. Change socks repeatedly through the day if the feet do sweat. Tennis shoes or other foot wear which appear somewhat moldy should be discarded.

## 2013-10-18 NOTE — Progress Notes (Signed)
   Subjective:    Patient ID: Jeremy Ellis, male    DOB: Aug 30, 1961, 52 y.o.   MRN: [id number]  HPI He has several concerns. He's had intermittent cough since October of this year. It is typically worse in the morning and improves throughout the day. Associated with some yellow-green postnasal drainage.  He also feels as if he can hear his pulse in his right ear. This is been an issue intermittently over the last 3-4 weeks.  He's had intermittent red discoloration/rash in the right paranasal area, left temple and in the forehead area. This began this Summer; he questioned further he may been bitten/stung by a vector. It had been less noticeable during the summer months but now is brought to his attention by coworkers. He does use 1% steroid cream with some benefit.  He's had some soreness and some discoloration of the right great toenail for approximately 12 months. This did seem to improve after he clipped the nail more closely.    Review of Systems  He denies fever, chills, or sweats.  He has no frontal headache, facial pain, frank nasal purulence, dental pain, sore throat, otic pain, or otic discharge.  There is no shortness of breath or wheezing associated with the cough.     Objective:   Physical Exam General appearance:good health ;well nourished; no acute distress or increased work of breathing is present.  No  lymphadenopathy about the head, neck, or axilla noted.   Eyes: No conjunctival inflammation or lid edema is present. There is no scleral icterus.  Ears:  External ear exam shows no significant lesions or deformities.  Otoscopic examination reveals impactions bilaterally Nose:  External nasal examination shows no deformity or inflammation. Nasal mucosa are pink and moist without lesions or exudates. L septal dislocation & R deviation.No obstruction to airflow.   Oral exam: Dental hygiene is good; lips and gums are healthy appearing.There is no oropharyngeal erythema  or exudate noted.   Neck:  No deformities, masses, or tenderness noted.     Heart:  Normal rate and regular rhythm. S1 and S2 normal without gallop, murmur, click, rub or other extra sounds. No carotid bruits present  Lungs:Chest clear to auscultation; no wheezes, rhonchi,rales ,or rubs present.No increased work of breathing.    Extremities:  No cyanosis, edema, or clubbing  noted . The onycholytic changes of the right great toenail   Skin: Warm & dry w/o jaundice or tenting. Faint erythema right paranasal area         Assessment & Plan:  #1 cough from Post Nasal Drainage #2 subjective R carotid bruit #3 Rosacea #4onycholysis  See orders

## 2013-11-01 ENCOUNTER — Encounter: Payer: Self-pay | Admitting: Internal Medicine

## 2013-11-03 ENCOUNTER — Encounter: Payer: Self-pay | Admitting: Internal Medicine

## 2013-11-05 NOTE — Telephone Encounter (Signed)
Please advise 

## 2013-11-24 ENCOUNTER — Encounter: Payer: Self-pay | Admitting: Internal Medicine

## 2013-11-29 ENCOUNTER — Telehealth: Payer: Self-pay | Admitting: *Deleted

## 2013-11-29 NOTE — Telephone Encounter (Signed)
[facility] Triage Call Report Triage Record Num: [id number] Operator: Noemi Chapel Patient Name: Jeremy Ellis Call Date & Time: 11/27/2013 9:29:39PM Patient Phone: [phone] PCP: Unice Cobble Patient Gender: Male PCP Fax : [phone] Patient DOB: Mar 15, 1961 Practice Name[facility] Reason for Call: Caller: Stan/Patient; PCP: Unice Cobble; CB#: [phone]; Call regarding nausea that began this evening, Saturday 11/27/13 around [place]. He has not vomited and has no diarrhea. Caller reports he was treated for sinusitis over the holidays and has now developed sore, scratchy throat, coughing, and decreased appetite again. He has had productive cough with yellowish mucous. Last voided at 8PM. He spoke with PCP on Thurs 11/25/13 and Dr Linna Darner asked him to come in this next week to be seen for URI symptoms. Afebrile. Per Nausea or Vomiting Protocol, All other situations, Home care with callback parameters given. RN called in "Zofran 8mg  1 po Q 8 hours as needed, #6 with no refills" per Practice profile, to [facility] at [facility] at [phone]. Caller advised. Protocol(s) Used: Nausea or Vomiting Recommended Outcome per Protocol: Provide Home/Self Care Reason for Outcome: All other situations Care Advice: Call provider immediately if develop severe pain, black, tarry stools, bloody stools, blood-streaked or coffee ground-looking vomitus, or abdomen swollen. ~ Nausea Care Advice: - Drink small amounts of clear, sweetened liquids or ice cold drinks. - Eat light, bland foods such as saltine crackers or plain bread. - Do not eat high fat, highly seasoned, high fiber, or high sugar content foods. - Avoid mixing hot food and cold foods. - Eat smaller, more frequent meals. - Rest as much as possible in a sitting or in a propped lying position. Do not lie flat for at least 2 hours after eating. - Do not take pain medication (such as aspirin, NSAIDs) while nauseated. -  Rest as much as possible until symptoms improve since activity may worsen nausea. ~ Vomiting Care Advice: - Do not eat solid foods until vomiting subsides. - Begin taking fluids by sucking on ice chips or popsicles or taking sips of cool clear, nonprescription oral rehydration solution). - Gradually drink larger amounts of these fluids so that you are drinking six to eight 8 oz. (.2 liter) of fluids a day. - Keep activity to a minimum. - After vomiting subsides, eat smaller, more frequent meals of easily digested foods such as crackers, toast, bananas, rice, cooked cereal, applesauce, broth, baked or mashed potatoes, chicken or [place] without skin. Eat slowly. - Take fluids 30 minutes before or 60 minutes after meals. - Avoid high fat, highly seasoned, high fiber or high sugar content foods. - Avoid extremely hot or cold foods. - Do not take pain medication (such as aspirin, NSAIDs) while nauseated or vomiting. - Consult your provider for advice regarding continuing prescription medication. - Rest as much as possible in a sitting or in a propped lying position. Do not lie flat for at least 2 hours after eating. ~ Go to the ED if you have developed signs and symptoms of dehydration such as very dry mouth and tongue; increased pulse rate at rest; no urine output for 12 hours or more; increasing weakness or drowsiness, or lightheadedness when trying to sit upright or standing. ~ ~ Call provider within 8 hours if unable to keep any fluids down for more than 8 hours, fever over 101.5 F (38.6 C), or 01/

## 2013-12-07 ENCOUNTER — Encounter: Payer: Self-pay | Admitting: Internal Medicine

## 2013-12-13 ENCOUNTER — Encounter: Payer: Self-pay | Admitting: Internal Medicine

## 2014-03-11 ENCOUNTER — Other Ambulatory Visit: Payer: Self-pay | Admitting: Internal Medicine

## 2014-03-11 ENCOUNTER — Encounter: Payer: Self-pay | Admitting: Internal Medicine

## 2014-03-11 DIAGNOSIS — Z0000 Encounter for general adult medical examination without abnormal findings: Secondary | ICD-10-CM

## 2014-03-14 ENCOUNTER — Encounter: Payer: Self-pay | Admitting: Internal Medicine

## 2014-03-14 ENCOUNTER — Ambulatory Visit (INDEPENDENT_AMBULATORY_CARE_PROVIDER_SITE_OTHER): Payer: 59 | Admitting: Internal Medicine

## 2014-03-14 ENCOUNTER — Other Ambulatory Visit: Payer: 59

## 2014-03-14 VITALS — BP 120/84 | HR 72 | Temp 98.2°F | Resp 13 | Ht 71.5 in | Wt 158.4 lb

## 2014-03-14 DIAGNOSIS — Z860100 Personal history of colon polyps, unspecified: Secondary | ICD-10-CM | POA: Insufficient documentation

## 2014-03-14 DIAGNOSIS — E785 Hyperlipidemia, unspecified: Secondary | ICD-10-CM

## 2014-03-14 DIAGNOSIS — K5289 Other specified noninfective gastroenteritis and colitis: Secondary | ICD-10-CM

## 2014-03-14 DIAGNOSIS — Z0000 Encounter for general adult medical examination without abnormal findings: Secondary | ICD-10-CM

## 2014-03-14 MED ORDER — PRAVASTATIN SODIUM 20 MG PO TABS: ORAL_TABLET | ORAL | Status: DC

## 2014-03-14 NOTE — Progress Notes (Signed)
Pre visit review using our clinic review tool, if applicable. No additional management support is needed unless otherwise documented below in the visit note. 

## 2014-03-14 NOTE — Addendum Note (Signed)
Addended by: Roma Schanz R on: 03/14/2014 10:52 AM   Modules accepted: Orders

## 2014-03-14 NOTE — Progress Notes (Signed)
   Subjective:    Patient ID: Jeremy Ellis, male    DOB: 1961/02/03, 53 y.o.   MRN: [id number]  HPI  He is here for a physical;acute issues denied  A heart healthy diet is followed; exercise encompasses  . 60 minutes > 5 times per week as cardio & resistance without symptoms.  Family history is positive for premature coronary disease. Advanced cholesterol testing reveals  LDL goal is less than 100; ideally <70 . Since 2/15 off the statin ("ran out").  Low dose ASA not taken   Review of Systems Specifically denied are  chest pain, palpitations, dyspnea, or claudication.  Significant abdominal symptoms, memory deficit, or myalgias not present.        Objective:   Physical Exam Gen.: Thin but healthy and well-nourished in appearance. Alert, appropriate and cooperative throughout exam. Appears younger than stated age  Head: Normocephalic without obvious abnormalities;  no alopecia  Eyes: No corneal or conjunctival inflammation noted. Pupils equal round reactive to light and accommodation. Extraocular motion intact.  Ears: External  ear exam reveals no significant lesions or deformities. Wax bilaterally. Hearing is grossly normal bilaterally. Nose: External nasal exam reveals no deformity or inflammation. Nasal mucosa are pink and moist. No lesions or exudates noted.   Mouth: Oral mucosa and oropharynx reveal no lesions or exudates. Teeth in good repair. Small osteoma hard palate Neck: No deformities, masses, or tenderness noted. Range of motion &  Thyroid normal. Lungs: Normal respiratory effort; chest expands symmetrically. Lungs are clear to auscultation without rales, wheezes, or increased work of breathing. Heart: Normal rate and rhythm. Normal S1 and S2. No gallop, click, or rub. No murmur. Abdomen: Bowel sounds normal; abdomen soft and nontender. No masses, organomegaly or hernias noted. Genitalia: Genitalia normal except for mild bilateral inguinal weakness & small L varices.  Prostate is symmetrically enlarged w/o nodularity or induration                               Musculoskeletal/extremities: No deformity or scoliosis noted of  the thoracic or lumbar spine.   No clubbing, cyanosis, edema, or significant extremity  deformity noted. Range of motion normal .Tone & strength normal. Hand joints normal  Fingernail  health good. Able to lie down & sit up w/o help. Negative SLR bilaterally. Minor crepitus L knee Vascular: Carotid, radial artery, dorsalis pedis and  posterior tibial pulses are full and equal. No bruits present. Neurologic: Alert and oriented x3. Deep tendon reflexes symmetrical and normal.  Gait normal  including heel & toe walking . Rhomberg & finger to nose       Skin: Intact without suspicious lesions or rashes. Lymph: No cervical, axillary, or inguinal lymphadenopathy present. Psych: Mood and affect are normal. Normally interactive                                                                                        Assessment & Plan:  #1 comprehensive physical exam; no acute findings  Plan: see Orders  & Recommendations

## 2014-03-14 NOTE — Patient Instructions (Signed)
Your next office appointment will be determined based upon review of your pending labs. Those instructions will be transmitted to you through My Chart . 

## 2014-03-15 LAB — BASIC METABOLIC PANEL
BUN/Creatinine Ratio: 21 — ABNORMAL HIGH (ref 9–20)
BUN: 23 mg/dL (ref 6–24)
CALCIUM: 9 mg/dL (ref 8.7–10.2)
CHLORIDE: 102 mmol/L (ref 97–108)
CO2: 25 mmol/L (ref 18–29)
Creatinine, Ser: 1.1 mg/dL (ref 0.76–1.27)
GFR calc Af Amer: 88 mL/min/{1.73_m2} (ref >59)
GFR, EST NON AFRICAN AMERICAN: 76 mL/min/{1.73_m2} (ref >59)
GLUCOSE: 85 mg/dL (ref 65–99)
POTASSIUM: 4.4 mmol/L (ref 3.5–5.2)
SODIUM: 141 mmol/L (ref 134–144)

## 2014-03-15 LAB — TSH: TSH: 2.75 u[IU]/mL (ref 0.450–4.500)

## 2014-03-15 LAB — LIPID PANEL
Chol/HDL Ratio: 3.3 ratio units (ref 0.0–5.0)
Cholesterol, Total: 163 mg/dL (ref 100–199)
HDL: 50 mg/dL (ref >39)
LDL Calculated: 102 mg/dL — ABNORMAL HIGH (ref 0–99)
Triglycerides: 55 mg/dL (ref 0–149)
VLDL Cholesterol Cal: 11 mg/dL (ref 5–40)

## 2014-03-15 LAB — CBC WITH DIFFERENTIAL/PLATELET
BASOS: 0 %
Basophils Absolute: 0 10*3/uL (ref 0.0–0.2)
EOS: 10 %
Eosinophils Absolute: 0.4 10*3/uL (ref 0.0–0.4)
HEMATOCRIT: 43.8 % (ref 37.5–51.0)
Hemoglobin: 15.3 g/dL (ref 12.6–17.7)
IMMATURE GRANULOCYTES: 0 %
Immature Grans (Abs): 0 10*3/uL (ref 0.0–0.1)
LYMPHS ABS: 0.9 10*3/uL (ref 0.7–3.1)
Lymphs: 22 %
MCH: 33.3 pg — ABNORMAL HIGH (ref 26.6–33.0)
MCHC: 34.9 g/dL (ref 31.5–35.7)
MCV: 95 fL (ref 79–97)
Monocytes Absolute: 0.3 10*3/uL (ref 0.1–0.9)
Monocytes: 8 %
Neutrophils Absolute: 2.5 10*3/uL (ref 1.4–7.0)
Neutrophils Relative %: 60 %
RBC: 4.6 x10E6/uL (ref 4.14–5.80)
RDW: 13.2 % (ref 12.3–15.4)
WBC: 4.1 10*3/uL (ref 3.4–10.8)

## 2014-03-15 LAB — HEPATIC FUNCTION PANEL
ALT: 14 IU/L (ref 0–44)
AST: 20 IU/L (ref 0–40)
Albumin: 4.4 g/dL (ref 3.5–5.5)
Alkaline Phosphatase: 63 IU/L (ref 39–117)
Bilirubin, Direct: 0.12 mg/dL (ref 0.00–0.40)
Total Bilirubin: 0.5 mg/dL (ref 0.0–1.2)
Total Protein: 6.5 g/dL (ref 6.0–8.5)

## 2014-03-15 LAB — PSA: PSA: 0.7 ng/mL (ref 0.0–4.0)

## 2014-03-31 ENCOUNTER — Encounter: Payer: Self-pay | Admitting: Internal Medicine

## 2014-06-15 ENCOUNTER — Encounter: Payer: Self-pay | Admitting: Internal Medicine

## 2014-07-13 ENCOUNTER — Encounter: Payer: Self-pay | Admitting: Internal Medicine

## 2014-07-21 ENCOUNTER — Encounter: Payer: Self-pay | Admitting: Internal Medicine

## 2014-07-21 ENCOUNTER — Ambulatory Visit (INDEPENDENT_AMBULATORY_CARE_PROVIDER_SITE_OTHER): Payer: 59 | Admitting: Internal Medicine

## 2014-07-21 VITALS — BP 110/78 | HR 55 | Temp 98.2°F | Resp 12 | Wt 157.0 lb

## 2014-07-21 DIAGNOSIS — R0789 Other chest pain: Secondary | ICD-10-CM

## 2014-07-21 DIAGNOSIS — F4322 Adjustment disorder with anxiety: Secondary | ICD-10-CM

## 2014-07-21 NOTE — Patient Instructions (Addendum)
The Stress Test will be scheduled and you'll be notified of the time.  Plain Mucinex (NOT D) for thick secretions ;force NON dairy fluids .   Nasal cleansing in the shower as discussed with lather of mild shampoo.After 10 seconds wash off lather while  exhaling through nostrils. Make sure that all residual soap is removed to prevent irritation.  Flonase OR Nasacort AQ 1 spray in each nostril twice a day as needed. Use the "crossover" technique into opposite nostril spraying toward opposite ear @ 45 degree angle, not straight up into nostril.  Use a Neti pot daily only  as needed for significant sinus congestion; going from open side to congested side . Plain Allegra (NOT D )  160 daily , Loratidine 10 mg , OR Zyrtec 10 mg @ bedtime  as needed for itchy eyes & sneezing.   Biofeedback techniques such as meditation are very effective in controlling & reversing our physiologic response to stress. I was skeptical at first ; but I personally  experienced dramatic changes in stress indicators after learning how to use these interventions. For example my blood pressure decreased from the 160/100 range to 120/60 employing such exercises as part of a Cardiac Rehab program after my heart attack.The documented improvement occurred within 10-15 minutes. Persistent hypertension is a risk factor for stroke ( and another heart attack!) We may not be able to change stresses in our lives ( a work schedule meant for two; a quarrelsome in Sports coach ; etc ) BUT we can control our emotional response to these life experiences by learning & employing these techniques. Added benefits NOM:VEHM learned, "refills" are free ; available @ all times ; NO adverse side effects). Take it from the former skeptic who is a Designer, television/film set".

## 2014-07-21 NOTE — Progress Notes (Signed)
Pre visit review using our clinic review tool, if applicable. No additional management support is needed unless otherwise documented below in the visit note. 

## 2014-07-21 NOTE — Progress Notes (Signed)
   Subjective:    Patient ID: Jeremy Ellis, male    DOB: [birth year], 53 y.o.   MRN: [id number]  HPI He is concerned about chest pain which occurs when he is rushing to or in stressful  meetings; it does not occur with CVE (see below).Pain is central w/o tightness ,radiation, nausea or diaphoresis. It lasts 10-15 secondsHe has had 2 episodes in past 9 months, most recently 07/19/14. No paroxysmal headache, tachycardia or hypertension reported.  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  Family history is borderline for premature coronary disease (MGF MI in his 60s). Advanced cholesterol testing reveals  LDL goal is less than 100 ; ideally < 70. There is medication compliance with the statin.  Low dose ASA taken.  No GI symptoms. Recent minor upper respiratory symptoms present.  Review of Systems     Palpitations, tachycardia, exertional dyspnea, paroxysmal nocturnal dyspnea, claudication or edema are absent. Significant abdominal symptoms, memory deficit, or myalgias not present.  Unexplained weight loss, abdominal pain, significant dyspepsia, dysphagia, melena, or rectal bleeding not present  He also describes some scant nasal purulence and some sinus pressure as recently. He has no significant symptoms of rhinosinusitis.  Frontal headache, facial pain , nasal purulence, dental pain, sore throat , otic pain or otic discharge denied. No fever , chills or sweats.     Objective:   Physical Exam  Gen.:  Thin but well-nourished; in no acute distress Eyes: Extraocular motion intact; no lid lag or proptosis ,nystagmus. Vision normal . ENT: wax R canal; L TM WNL. Neck: full ROM; no masses ; thyroid normal  Heart: Normal rhythm and slow rate without significant murmur, gallop, or extra heart sounds Lungs: Chest clear to auscultation without rales,rales, wheezes Neuro:Deep tendon reflexes are equal and  within normal limits; no tremor  Skin: Warm and dry without significant lesions or rashes; no onycholysis Lymphatic: no cervical or axillary LA Psych: Normally communicative and interactive; but subtley anxious with rigid posture .      Assessment & Plan:  #1 non exertional chest in context of stress  #2 URT symptoms w/o frank rhinosinusitis #3 anxiety Plan: see orders Options to treat anxiety discussed ( prn propranolol somewhat contraindicated due to bradycardia; sedative might impair job performance; biofeedback techniques appear best option)

## 2014-08-25 ENCOUNTER — Telehealth (HOSPITAL_COMMUNITY): Payer: Self-pay

## 2014-08-25 NOTE — Telephone Encounter (Signed)
Encounter complete. 

## 2014-08-26 ENCOUNTER — Telehealth (HOSPITAL_COMMUNITY): Payer: Self-pay

## 2014-08-26 NOTE — Telephone Encounter (Signed)
Encounter complete. 

## 2014-08-29 ENCOUNTER — Telehealth (HOSPITAL_COMMUNITY): Payer: Self-pay | Admitting: *Deleted

## 2014-08-30 ENCOUNTER — Ambulatory Visit (INDEPENDENT_AMBULATORY_CARE_PROVIDER_SITE_OTHER): Payer: 59 | Admitting: Internal Medicine

## 2014-08-30 ENCOUNTER — Encounter: Payer: Self-pay | Admitting: Internal Medicine

## 2014-08-30 ENCOUNTER — Encounter (HOSPITAL_COMMUNITY): Payer: 59

## 2014-08-30 VITALS — BP 114/84 | HR 101 | Temp 98.2°F | Resp 13 | Wt 155.4 lb

## 2014-08-30 DIAGNOSIS — J209 Acute bronchitis, unspecified: Secondary | ICD-10-CM

## 2014-08-30 MED ORDER — DOXYCYCLINE HYCLATE 100 MG PO TABS: 100.0000 mg | ORAL_TABLET | Freq: Two times a day (BID) | ORAL | Status: DC

## 2014-08-30 NOTE — Progress Notes (Signed)
Pre visit review using our clinic review tool, if applicable. No additional management support is needed unless otherwise documented below in the visit note. 

## 2014-08-30 NOTE — Progress Notes (Signed)
   Subjective:    Patient ID: Jeremy Ellis, male    DOB: Dec 20, 1960, 53 y.o.   MRN: [id number]  HPI   On 08/27/14 he had otic pain after he got water in his ear in the shower. Sore throat 07/27/14 ; progression to head  congestion over weekend. Low grade fever , chills  10/18 & 10/19. As of 10/19 there is significant cough with light yellow sputum .On 10/18 & 19 he had frontal headache, facial pain & ,light yellow nasal purulence    Review of Systems Extrinsic symptoms of itchy, watery eyes or angioedema are denied. No wheezing, dyspnea or  paroxysmal nocturnal dyspnea with the cough.      Objective:   Physical Exam General appearance:Thin but in good health ;well nourished; no acute distress or increased work of breathing is present.  No  lymphadenopathy about the head, neck, or axilla noted.   Eyes: No conjunctival inflammation or lid edema is present. There is no scleral icterus.  Ears:  External ear exam shows no significant lesions or deformities. Wax bilaterally   Nose:  External nasal examination shows no deformity or inflammation. Nasal mucosa are pink and moist without lesions or exudates. Rightward septal deviation.No obstruction to airflow.   Oral exam: Dental hygiene is good; lips and gums are healthy appearing.There is no oropharyngeal erythema or exudate noted.   Neck:  No deformities, thyromegaly, masses, or tenderness noted.   Supple with full range of motion without pain.   Heart: Repeat pulse 90. Normal rate and regular rhythm. S1 and S2 normal without gallop, murmur, click, rub or other extra sounds.   Lungs:Chest clear to auscultation; no wheezes, rhonchi,rales ,or rubs present.No increased work of breathing.  Dry cough  Extremities:  No cyanosis, edema, or clubbing  noted    Skin: Warm & dry w/o jaundice or tenting.         Assessment & Plan:  #1 acute bronchitis w/o bronchospasm #2 URI, acute Plan: See orders and recommendations

## 2014-08-30 NOTE — Patient Instructions (Signed)
Plain Mucinex (NOT D) for thick secretions ;force NON dairy fluids .   Nasal cleansing in the shower as discussed with lather of mild shampoo.After 10 seconds wash off lather while  exhaling through nostrils. Make sure that all residual soap is removed to prevent irritation.  Flonase OR Nasacort AQ 1 spray in each nostril twice a day as needed. Use the "crossover" technique into opposite nostril spraying toward opposite ear @ 45 degree angle, not straight up into nostril.  Use a Neti pot daily only  as needed for significant sinus congestion; going from open side to congested side . Plain Allegra (NOT D )  160 daily , Loratidine 10 mg , OR Zyrtec 10 mg @ bedtime  as needed for itchy eyes & sneezing. Mucinex DM or Robitussin DM as needed for cough. Carry room temperature water and sip liberally after coughing.

## 2014-09-12 ENCOUNTER — Encounter: Payer: Self-pay | Admitting: Internal Medicine

## 2014-09-16 ENCOUNTER — Ambulatory Visit: Payer: 59

## 2014-10-03 ENCOUNTER — Encounter: Payer: Self-pay | Admitting: Internal Medicine

## 2014-10-04 ENCOUNTER — Ambulatory Visit (INDEPENDENT_AMBULATORY_CARE_PROVIDER_SITE_OTHER): Payer: 59 | Admitting: *Deleted

## 2014-10-04 DIAGNOSIS — Z23 Encounter for immunization: Secondary | ICD-10-CM

## 2014-11-09 ENCOUNTER — Encounter: Payer: Self-pay | Admitting: Internal Medicine

## 2014-12-06 ENCOUNTER — Encounter: Payer: Self-pay | Admitting: Internal Medicine

## 2014-12-09 ENCOUNTER — Telehealth: Payer: Self-pay | Admitting: Internal Medicine

## 2014-12-09 NOTE — Telephone Encounter (Signed)
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 Please advise.

## 2014-12-12 NOTE — Telephone Encounter (Signed)
Phone call to patient. No answer. Left voicemail written lab orders can be picked up at [facility] office front desk.

## 2014-12-12 NOTE — Telephone Encounter (Signed)
Please see Rx 

## 2015-03-06 ENCOUNTER — Encounter: Payer: Self-pay | Admitting: Internal Medicine

## 2015-03-17 ENCOUNTER — Encounter: Payer: Self-pay | Admitting: Internal Medicine

## 2015-04-12 ENCOUNTER — Encounter: Payer: Self-pay | Admitting: Internal Medicine

## 2015-04-13 ENCOUNTER — Encounter: Payer: Self-pay | Admitting: Internal Medicine

## 2015-04-26 ENCOUNTER — Encounter: Payer: Self-pay | Admitting: Internal Medicine

## 2015-05-02 ENCOUNTER — Encounter: Payer: Self-pay | Admitting: Internal Medicine

## 2015-05-02 NOTE — Telephone Encounter (Signed)
Please advise 

## 2015-05-09 ENCOUNTER — Encounter: Payer: Self-pay | Admitting: Internal Medicine

## 2015-05-11 ENCOUNTER — Encounter: Payer: Self-pay | Admitting: Internal Medicine

## 2015-05-19 ENCOUNTER — Telehealth: Payer: Self-pay | Admitting: Emergency Medicine

## 2015-05-19 NOTE — Telephone Encounter (Signed)
Recent lab results from [place] have been mailed to pt.

## 2015-05-25 ENCOUNTER — Encounter: Payer: Self-pay | Admitting: Internal Medicine

## 2015-06-01 ENCOUNTER — Ambulatory Visit (INDEPENDENT_AMBULATORY_CARE_PROVIDER_SITE_OTHER): Payer: 59 | Admitting: Internal Medicine

## 2015-06-01 ENCOUNTER — Encounter: Payer: Self-pay | Admitting: Internal Medicine

## 2015-06-01 VITALS — BP 124/76 | HR 71 | Temp 97.9°F | Resp 14 | Ht 71.0 in | Wt 154.0 lb

## 2015-06-01 DIAGNOSIS — Z0000 Encounter for general adult medical examination without abnormal findings: Secondary | ICD-10-CM | POA: Diagnosis not present

## 2015-06-01 DIAGNOSIS — I7300 Raynaud's syndrome without gangrene: Secondary | ICD-10-CM | POA: Insufficient documentation

## 2015-06-01 NOTE — Progress Notes (Signed)
   Subjective:    Patient ID: Jeremy Ellis, male    DOB: 1961/03/09, 54 y.o.   MRN: [id number]  HPI He is here for a physical;acute issues denied.  He is on a heart healthy diet. He exercises 45 minutes at least 5 days a week as cardio or resistance. He is on no prescription medications.  Last month after doing resistance exercises he had some chest soreness which resolved.  He tends to have headache behind 1 eye or the other at the end of the day. This responses to Tylenol. Ophthalmologic exam is pending.  He has numbness of the index finger with exposure to air conditioning or cold.  Review of Systems   Chest pain, palpitations, tachycardia, exertional dyspnea, paroxysmal nocturnal dyspnea, claudication or edema are absent. No unexplained weight loss, abdominal pain, significant dyspepsia, dysphagia, melena, rectal bleeding, or persistently small caliber stools. Dysuria, pyuria, hematuria, frequency, nocturia or polyuria are denied. Change in hair, skin, nails denied. No bowel changes of constipation or diarrhea. No intolerance to heat or cold.      Objective:   Physical Exam  Pertinent or positive findings include: Wax occludes both otic canals. The nasal septum is deviated to the right. Prostate is 1.75+ enlarged without nodularity or induration. There is weakness in the right inguinal canal but no frank hernia. He is minor crepitus of the knees.  General appearance :adequately nourished; in no distress.  Eyes: No conjunctival inflammation or scleral icterus is present.  Oral exam:  Lips and gums are healthy appearing.There is no oropharyngeal erythema or exudate noted. Dental hygiene is good.  Heart:  Normal rate and regular rhythm. S1 and S2 normal without gallop, murmur, click, rub or other extra sounds    Lungs:Chest clear to auscultation; no wheezes, rhonchi,rales ,or rubs present.No increased work of breathing.   Abdomen: bowel sounds normal, soft and non-tender  without masses, or organomegaly noted.  Vascular : all pulses equal ; no bruits present.  Skin:Warm & dry.  Intact without suspicious lesions or rashes ; no tenting or jaundice   Lymphatic: No lymphadenopathy is noted about the head, neck, axilla, or inguinal areas.   Neuro: Strength, tone & DTRs normal.         Assessment & Plan:  #1 comprehensive physical exam; no acute findings  #2 cerumen impactions   #3 Raynaud's phenomena  Plan: see Orders  & Recommendations

## 2015-06-01 NOTE — Progress Notes (Signed)
Pre visit review using our clinic review tool, if applicable. No additional management support is needed unless otherwise documented below in the visit note. 

## 2015-06-01 NOTE — Patient Instructions (Addendum)
Please do not use Q-tips as this simply packs the wax down against he eardrum. Should wax build up occur, please put 2-3 drops of mineral oil in the ear at night and cover the canal with a  cotton ball.In the morning fill the canal with hydrogen peroxide & leave  for 10-15 minutes.Following this shower and use the thinnest washrag available to wick out the wax.   Protect  hands, feet, and ears from cold exposure including ice chests. Use silk sock & mitten liners; these can be purchased at outdoor supply stores.

## 2015-06-05 ENCOUNTER — Encounter: Payer: Self-pay | Admitting: Internal Medicine

## 2015-06-06 ENCOUNTER — Encounter: Payer: Self-pay | Admitting: Internal Medicine

## 2015-06-07 NOTE — Telephone Encounter (Signed)
Please advise 

## 2015-07-11 ENCOUNTER — Encounter: Payer: Self-pay | Admitting: Internal Medicine

## 2015-07-11 NOTE — Telephone Encounter (Signed)
Please advise 

## 2015-07-24 ENCOUNTER — Telehealth: Payer: Self-pay | Admitting: *Deleted

## 2015-07-24 ENCOUNTER — Encounter: Payer: Self-pay | Admitting: Internal Medicine

## 2015-07-24 NOTE — Telephone Encounter (Signed)
A user error has taken place: open by mistake.../lmb  

## 2015-07-24 NOTE — Telephone Encounter (Signed)
Called pt spoke with lady she gave me his cell [phone] to call him. Called pt no answer LMOM with md advisement will also return email with md response...Jeremy Ellis

## 2015-07-25 ENCOUNTER — Other Ambulatory Visit: Payer: Self-pay | Admitting: Internal Medicine

## 2015-07-25 DIAGNOSIS — H9191 Unspecified hearing loss, right ear: Secondary | ICD-10-CM

## 2015-07-26 ENCOUNTER — Encounter: Payer: Self-pay | Admitting: Internal Medicine

## 2015-07-31 ENCOUNTER — Encounter: Payer: Self-pay | Admitting: Internal Medicine

## 2015-08-08 ENCOUNTER — Encounter: Payer: Self-pay | Admitting: Internal Medicine

## 2015-08-23 ENCOUNTER — Encounter: Payer: Self-pay | Admitting: Internal Medicine

## 2015-09-19 ENCOUNTER — Encounter: Payer: Self-pay | Admitting: Internal Medicine

## 2015-09-22 ENCOUNTER — Ambulatory Visit (INDEPENDENT_AMBULATORY_CARE_PROVIDER_SITE_OTHER): Payer: 59 | Admitting: Internal Medicine

## 2015-09-22 ENCOUNTER — Encounter: Payer: Self-pay | Admitting: Internal Medicine

## 2015-09-22 VITALS — BP 110/72 | HR 77 | Temp 98.5°F | Ht 71.0 in | Wt 155.0 lb

## 2015-09-22 DIAGNOSIS — B9789 Other viral agents as the cause of diseases classified elsewhere: Secondary | ICD-10-CM

## 2015-09-22 DIAGNOSIS — B349 Viral infection, unspecified: Secondary | ICD-10-CM | POA: Diagnosis not present

## 2015-09-22 DIAGNOSIS — R110 Nausea: Secondary | ICD-10-CM

## 2015-09-22 DIAGNOSIS — J988 Other specified respiratory disorders: Principal | ICD-10-CM

## 2015-09-22 LAB — POCT URINALYSIS DIPSTICK
BILIRUBIN UA: NEGATIVE
GLUCOSE UA: NEGATIVE
KETONES UA: NEGATIVE
Leukocytes, UA: NEGATIVE
Nitrite, UA: NEGATIVE
Protein, UA: NEGATIVE
RBC UA: NEGATIVE
Spec Grav, UA: 1.025
UROBILINOGEN UA: NEGATIVE
pH, UA: 6

## 2015-09-22 MED ORDER — ONDANSETRON HCL 4 MG PO TABS: 4.0000 mg | ORAL_TABLET | Freq: Three times a day (TID) | ORAL | Status: DC | PRN

## 2015-09-22 MED ORDER — DOXYCYCLINE HYCLATE 100 MG PO TABS: 100.0000 mg | ORAL_TABLET | Freq: Two times a day (BID) | ORAL | Status: DC

## 2015-09-22 NOTE — Patient Instructions (Signed)
Plain Mucinex (NOT D) for thick secretions ;force NON dairy fluids .   Nasal cleansing in the shower as discussed with lather of mild shampoo.After 10 seconds wash off lather while  exhaling through nostrils. Make sure that all residual soap is removed to prevent irritation.  Flonase OR Nasacort AQ 1 spray in each nostril twice a day as needed. Use the "crossover" technique into opposite nostril spraying toward opposite ear @ 45 degree angle, not straight up into nostril.  Plain Allegra (NOT D )  160 daily , Loratidine 10 mg , OR Zyrtec 10 mg @ bedtime  as needed for itchy eyes & sneezing.   Stay on clear liquids for 48-72 hours or nausea gone.This would include  jello, sherbert (NOT ice cream), Lipton's chicken noodle soup(NOT cream based soups),Gatorade Lite, flat Ginger ale (without High Fructose Corn Syrup),dry toast or crackers, baked potato.No milk , dairy or grease until bowels are formed. Align , a [employer] , daily if stools are loose. Immodium AD for frankly watery stool. Report increasing pain, fever or rectal bleeding.  Fill the  prescription for antibiotic if fever, discolored nasal or chest secretions or significant pain above & below eyes appear in the next 48-72 hours.

## 2015-09-22 NOTE — Progress Notes (Signed)
Pre visit review using our clinic review tool, if applicable. No additional management support is needed unless otherwise documented below in the visit note. 

## 2015-09-22 NOTE — Progress Notes (Signed)
   Subjective:    Patient ID: Jeremy Ellis, male    DOB: 1960/12/23, 54 y.o.   MRN: [id number]  HPI  His symptoms began 09/19/15 as fatigue, chills, watery eyes, sneezing, and intermittent cough with some green/yellow sputum. He also describes some intermittent frontal headache as well as scratchy throat. Nasal discharge has been clear to yellow/green. He has taken Tylenol twice a day for the headache with response. He also took loratadine for the extrinsic symptoms. He had nausea this morning without vomiting or diarrhea. He has had some decreased appetite.  Review of Systems  He denies definite fever. He also denies facial pain, otic pain, otic discharge, shortness of breath, or wheezing.     Objective:   Physical Exam  He has some wax in each external canal. There are no impactions. There is an osteoma over the hard palate. There was a small white vesicle which was removed with the tongue depressor. There is mild erythema of the pharynx  General appearance:Adequately nourished; no acute distress or increased work of breathing is present.    Lymphatic: No  lymphadenopathy about the head, neck, or axilla .  Eyes: No conjunctival inflammation or lid edema is present. There is no scleral icterus.  Ears:  External ear exam shows no significant lesions or deformities. Visualized tympanic membranes are intact bilaterally without bulging, retraction, inflammation or discharge.  Nose:  External nasal examination shows no deformity or inflammation. Nasal mucosa are pink and moist without lesions or exudates No septal dislocation or deviation.No obstruction to airflow.   Oral exam: Dental hygiene is good; lips and gums are healthy appearing.There is no oropharyngeal exudate .  Neck:  No deformities, thyromegaly, masses, or tenderness noted.   Supple with full range of motion without pain.   Heart:  Normal rate and regular rhythm. S1 and S2 normal without gallop, murmur, click, rub or  other extra sounds.   Lungs:Chest clear to auscultation; no wheezes, rhonchi,rales ,or rubs present.  Extremities:  No cyanosis, edema, or clubbing  noted    Skin: Warm & dry w/o tenting or jaundice. No significant lesions or rash.     Assessment & Plan:  #1 viral respiratory tract infection  #2 nausea and anorexia  Plan: See orders recommendations

## 2015-10-19 ENCOUNTER — Encounter: Payer: Self-pay | Admitting: Internal Medicine

## 2015-10-25 ENCOUNTER — Encounter: Payer: Self-pay | Admitting: Internal Medicine

## 2015-11-02 ENCOUNTER — Ambulatory Visit (INDEPENDENT_AMBULATORY_CARE_PROVIDER_SITE_OTHER): Payer: 59 | Admitting: Internal Medicine

## 2015-11-02 ENCOUNTER — Other Ambulatory Visit (INDEPENDENT_AMBULATORY_CARE_PROVIDER_SITE_OTHER): Payer: 59

## 2015-11-02 ENCOUNTER — Encounter: Payer: Self-pay | Admitting: Internal Medicine

## 2015-11-02 VITALS — BP 120/72 | HR 64 | Temp 98.3°F | Resp 14 | Ht 70.5 in | Wt 158.8 lb

## 2015-11-02 DIAGNOSIS — Z8249 Family history of ischemic heart disease and other diseases of the circulatory system: Secondary | ICD-10-CM

## 2015-11-02 DIAGNOSIS — E785 Hyperlipidemia, unspecified: Secondary | ICD-10-CM

## 2015-11-02 DIAGNOSIS — R0789 Other chest pain: Secondary | ICD-10-CM | POA: Diagnosis not present

## 2015-11-02 NOTE — Progress Notes (Signed)
   Subjective:    Patient ID: Jeremy Ellis, male    DOB: 1961/06/27, 54 y.o.   MRN: [id number]  HPI He has had atypical chest pain intermittently. He was even scheduled for a stress test in 2015 but this was never completed due to scheduling conflicts.  He is concerned as two coworkers had MI in their mid 69s. His pastor had heart attack and died.  His father had a stroke at 42 of heart attack at 50. Paternal grandfather had heart attack on 5 occasions; the first was before age 73. Paternal grandfather had a heart attack in his 37s.  He has controlled his cholesterol with nutrition and exercise. He has been on a heart healthy diet except for some red meat. He avoids fried foods. He does not add salt @ the table. He exercises for 30 minutes 3 times a week as cardio without active symptoms  His most recent LDL was less than 100. He has never smoked and drinks minimally.  Review of Systems Palpitations, tachycardia, exertional dyspnea, paroxysmal nocturnal dyspnea, claudication or edema are absent.     Objective:   Physical Exam   Thin but he appears healthy and well-nourished & in no acute distress  No carotid bruits are present.No neck vein distention present at 10 - 15 degrees. Thyroid normal to palpation  Heart rhythm and rate are normal with no gallop or murmur  Chest is clear with no increased work of breathing  There is no evidence of aortic aneurysm or renal artery bruits  Abdomen soft with no organomegaly or masses. No HJR  No clubbing, cyanosis or edema present.  Pedal pulses are intact   No ischemic skin changes are present . Fingernails healthy   Alert and oriented. Strength, tone, DTRs reflexes normal       Assessment & Plan:  #1 history of atypical chest pain  #2 minimal dyslipidemia  #3 family history of premature coronary artery disease  Plan: Stress test will be rescheduled  C-reactive protein will be drawn.

## 2015-11-02 NOTE — Progress Notes (Signed)
Pre visit review using our clinic review tool, if applicable. No additional management support is needed unless otherwise documented below in the visit note. 

## 2015-11-02 NOTE — Patient Instructions (Signed)
The Stress Test  referral will be scheduled and you'll be notified of the time.Please call the Referral Co-Ordinator @ [phone] if you have not been notified of appointment time within 7-10 days. 

## 2015-11-03 LAB — C-REACTIVE PROTEIN

## 2015-11-28 ENCOUNTER — Encounter: Payer: Self-pay | Admitting: Internal Medicine

## 2015-12-20 ENCOUNTER — Ambulatory Visit: Payer: 59 | Admitting: Internal Medicine

## 2015-12-24 ENCOUNTER — Encounter: Payer: Self-pay | Admitting: Internal Medicine

## 2015-12-26 ENCOUNTER — Ambulatory Visit (INDEPENDENT_AMBULATORY_CARE_PROVIDER_SITE_OTHER): Payer: 59 | Admitting: Internal Medicine

## 2015-12-26 ENCOUNTER — Ambulatory Visit: Payer: Self-pay | Admitting: Internal Medicine

## 2015-12-26 ENCOUNTER — Encounter: Payer: Self-pay | Admitting: Internal Medicine

## 2015-12-26 VITALS — BP 120/90 | HR 71 | Temp 98.4°F | Ht 70.5 in | Wt 155.0 lb

## 2015-12-26 DIAGNOSIS — R059 Cough, unspecified: Secondary | ICD-10-CM | POA: Insufficient documentation

## 2015-12-26 DIAGNOSIS — K219 Gastro-esophageal reflux disease without esophagitis: Secondary | ICD-10-CM

## 2015-12-26 DIAGNOSIS — R05 Cough: Secondary | ICD-10-CM | POA: Diagnosis not present

## 2015-12-26 DIAGNOSIS — J019 Acute sinusitis, unspecified: Secondary | ICD-10-CM | POA: Diagnosis not present

## 2015-12-26 MED ORDER — LEVOFLOXACIN 500 MG PO TABS: 500.0000 mg | ORAL_TABLET | Freq: Every day | ORAL | Status: DC

## 2015-12-26 MED ORDER — HYDROCODONE-HOMATROPINE 5-1.5 MG/5ML PO SYRP: 5.0000 mL | ORAL_SOLUTION | Freq: Four times a day (QID) | ORAL | Status: DC | PRN

## 2015-12-26 NOTE — Progress Notes (Signed)
Subjective:    Patient ID: Jeremy Ellis, male    DOB: 02/09/1961, 55 y.o.   MRN: [id number]  HPI  Here with 5 days acute onset fever, facial pain, pressure, headache, general weakness and malaise, and greenish d/c, with mild ST and persistent worsening non prod cough, but pt denies chest pain, wheezing, increased sob or doe, orthopnea, PND, increased LE swelling, palpitations, dizziness or syncope. OTC meds not working well including antihistamines. Has recurring mild reflux, but no abd pain, dysphagia, n/v, bowel change or blood.  Pt denies fever, wt loss, night sweats, loss of appetite, or other constitutional symptoms except for the above Past Medical History  Diagnosis Date  . Hyperlipidemia    Past Surgical History  Procedure Laterality Date  . Colonoscopy  2002     Ileitis on Biospy, Dr.Perry  . Colonoscopy with polypectomy  12/20/11    sessile serrated polyp    reports that he has never smoked. He has never used smokeless tobacco. He reports that he drinks about 0.6 oz of alcohol per week. He reports that he does not use illicit drugs. family history includes Alzheimer's disease in his maternal grandmother; Colon cancer (age of onset: 83) in his paternal grandmother; Diabetes in his father and maternal grandmother; Heart attack in his maternal grandfather; Heart attack (age of onset: 55) in his father; Heart attack (age of onset: 98) in his paternal grandfather; Heart failure in his father; Hyperlipidemia in his mother; Stroke (age of onset: 78) in his father. There is no history of Stomach cancer, Asthma, or COPD. Allergies  Allergen Reactions  . Erythromycin Swelling  . Penicillins Hives and Swelling  . Sulfonamide Derivatives Hives and Swelling   Current Outpatient Prescriptions on File Prior to Visit  Medication Sig Dispense Refill  . Cholecalciferol (VITAMIN D3) 400 UNITS CAPS Take by mouth daily.    Marland Kitchen ECHINACEA PO Take by mouth 2 (two) times daily. Reported on 11/02/2015     . Multiple Vitamin (MULTIVITAMIN) tablet Take 1 tablet by mouth daily.      . Omega-3 Fatty Acids (FISH OIL) 1000 MG CAPS Take by mouth. 300 mg of Omega 3 included, 1 by mouth twice daily    . Probiotic Product (PROBIOTIC DAILY PO) Take 1 tablet by mouth.    . Saw Palmetto 450 MG CAPS Take by mouth. 1 by mouth two times daily    . vitamin C (ASCORBIC ACID) 500 MG tablet Take 500 mg by mouth daily.     No current facility-administered medications on file prior to visit.   Review of Systems  Constitutional: Negative for unusual diaphoresis or night sweats HENT: Negative for ringing in ear or discharge Eyes: Negative for double vision or worsening visual disturbance.  Respiratory: Negative for choking and stridor.   Gastrointestinal: Negative for vomiting or other signifcant bowel change Genitourinary: Negative for hematuria or change in urine volume.  Musculoskeletal: Negative for other MSK pain or swelling Skin: Negative for color change and worsening wound.  Neurological: Negative for tremors and numbness other than noted  Psychiatric/Behavioral: Negative for decreased concentration or agitation other than above       Objective:   Physical Exam BP 120/90 mmHg  Pulse 71  Temp(Src) 98.4 F (36.9 C) (Oral)  Ht 5' 10.5" (1.791 m)  Wt 155 lb (70.308 kg)  BMI 21.92 kg/m2  SpO2 96% VS noted, mild ill Constitutional: Pt appears in no significant distress HENT: Head: NCAT.  Right Ear: External ear normal.  Left  Ear: External ear normal.  Bilat tm's with mild erythema.  Max sinus areas mild tender.  Pharynx with mild erythema, no exudate Eyes: . Pupils are equal, round, and reactive to light. Conjunctivae and EOM are normal Neck: Normal range of motion. Neck supple.  Cardiovascular: Normal rate and regular rhythm.   Pulmonary/Chest: Effort normal and breath sounds without rales or wheezing.  Abd:  Soft, NT, ND, + BS Neurological: Pt is alert. Not confused , motor grossly  intact Skin: Skin is warm. No rash, no LE edema Psychiatric: Pt behavior is normal. No agitation. except for markedly tense, nervous controlled demeanor    Assessment & Plan:

## 2015-12-26 NOTE — Patient Instructions (Signed)
Please take all new medication as prescribed - the antibiotic, and cough medicine if needed  You can also try OTC TUMS or zantac OTC for mild intermittent reflux symptoms if they occur  Please continue all other medications as before, and refills have been done if requested.  Please have the pharmacy call with any other refills you may need.  Please continue your efforts at being more active, low cholesterol diet, and weight control.  Please keep your appointments with your specialists as you may have planned

## 2015-12-26 NOTE — Progress Notes (Signed)
Pre visit review using our clinic review tool, if applicable. No additional management support is needed unless otherwise documented below in the visit note. 

## 2015-12-27 NOTE — Assessment & Plan Note (Signed)
Mild to mod, for antibx course,  to f/u any worsening symptoms or concerns 

## 2015-12-27 NOTE — Assessment & Plan Note (Signed)
Exam o/w benign, most likely related to upper resp symptoms, no evidence for pna at this time, I think ok to hold on cxr for now, to f/u any worsening symptoms or concerns

## 2015-12-27 NOTE — Assessment & Plan Note (Signed)
Overall mild and infrequent, d/w pt a low reflux inciting diet, ok for TUMS or zantac prn,  to f/u any worsening symptoms or concerns

## 2016-02-19 ENCOUNTER — Ambulatory Visit (INDEPENDENT_AMBULATORY_CARE_PROVIDER_SITE_OTHER): Payer: 59 | Admitting: Internal Medicine

## 2016-02-19 ENCOUNTER — Encounter: Payer: Self-pay | Admitting: Internal Medicine

## 2016-02-19 VITALS — BP 124/76 | HR 67 | Temp 98.3°F | Resp 16 | Ht 70.5 in | Wt 157.0 lb

## 2016-02-19 DIAGNOSIS — E785 Hyperlipidemia, unspecified: Secondary | ICD-10-CM | POA: Diagnosis not present

## 2016-02-19 DIAGNOSIS — R51 Headache: Secondary | ICD-10-CM

## 2016-02-19 DIAGNOSIS — R519 Headache, unspecified: Secondary | ICD-10-CM

## 2016-02-19 NOTE — Patient Instructions (Signed)
   All other Health Maintenance issues reviewed.   All recommended immunizations and age-appropriate screenings are up-to-date or discussed.  No immunizations administered today.   Medications reviewed and updated.  No changes recommended at this time.    Please followup once a year for a physical exam

## 2016-02-19 NOTE — Progress Notes (Signed)
Pre visit review using our clinic review tool, if applicable. No additional management support is needed unless otherwise documented below in the visit note. 

## 2016-02-19 NOTE — Progress Notes (Signed)
Subjective:    Patient ID: Jeremy Ellis, male    DOB: [birth year], 55 y.o.   MRN: [id number]  HPI He is here to establish with a new pcp.    Headaches: He gets minor headaches this time of the year.  He takes tylenol as needed, which is effective.  He denies watery eyes.  He has mild nasal congestion.  He likes to be outside and is outside a lot.  He has taken loratadine in the past, but not this year.  If he does not take the tylenol he gets nauseous if he lets the headache go.  He has occasional lightheadedness and maybe fatigue.  He denies any pattern to the headaches including relation to fatigue, dehydration, time of day, etc.  The headache is primarily located behind the left eye. He has had an eye check in the past several months.  Hyperlipidemia, concern for heart disease: He was wondering about having a stress test.  He did discuss this with Dr. Linna Darner and he thought he should have a stress test - it would not be covered by his insurance.  He was unsure if this was something he should pay for. He has never smoked, exercises regularly without difficulty including 30-40 minutes of cardio and does not have high blood pressure. He does have a family history of heart disease in his father and both grandparents.  Medications and allergies reviewed with patient and updated if appropriate.  Patient Active Problem List   Diagnosis Date Noted  . Cough 12/26/2015  . Raynaud's phenomenon 06/01/2015  . Personal history of colonic polyps 03/14/2014  . ILEITIS 01/24/2011  . DIZZINESS 01/31/2010  . GERD 09/15/2009  . Hyperlipidemia 03/21/2009  . HYPERPLASIA PROSTATE UNS W/O UR OBST & OTH LUTS 03/18/2008  . HERNIATED DISC 12/11/2006    Current Outpatient Prescriptions on File Prior to Visit  Medication Sig Dispense Refill  . Cholecalciferol (VITAMIN D3) 400 UNITS CAPS Take by mouth daily.    Marland Kitchen ECHINACEA PO Take by mouth 2 (two) times daily as needed. Reported on 11/02/2015    .  Multiple Vitamin (MULTIVITAMIN) tablet Take 1 tablet by mouth daily.      . Omega-3 Fatty Acids (FISH OIL) 1000 MG CAPS Take by mouth. 300 mg of Omega 3 included, 1 by mouth twice daily    . Probiotic Product (PROBIOTIC DAILY PO) Take 1 tablet by mouth as needed.     . Saw Palmetto 450 MG CAPS Take by mouth. 1 by mouth two times daily    . vitamin C (ASCORBIC ACID) 500 MG tablet Take 500 mg by mouth daily.     No current facility-administered medications on file prior to visit.    Past Medical History  Diagnosis Date  . Hyperlipidemia     Past Surgical History  Procedure Laterality Date  . Colonoscopy  2002     Ileitis on Biospy, Dr.Perry  . Colonoscopy with polypectomy  12/20/11    sessile serrated polyp    Social History   Social History  . Marital Status: Single    Spouse Name: N/A  . Number of Children: N/A  . Years of Education: N/A   Social History Main Topics  . Smoking status: Never Smoker   . Smokeless tobacco: Never Used  . Alcohol Use: 0.6 oz/week    1 Glasses of wine per week     Comment: Redwine 3-4x monthly or <  . Drug Use: No  . Sexual Activity: Not  Asked   Other Topics Concern  . None   Social History Narrative    Family History  Problem Relation Age of Onset  . Colon cancer Paternal Grandmother 30  . Stroke Father 58    hemorrhagic  . Heart failure Father   . Heart attack Father 62  . Diabetes Father   . Hyperlipidemia Mother   . Heart attack Paternal Grandfather 62  . Heart attack Maternal Grandfather     in 43s  . Diabetes Maternal Grandmother   . Alzheimer's disease Maternal Grandmother   . Stomach cancer Neg Hx   . Asthma Neg Hx   . COPD Neg Hx     Review of Systems  Constitutional: Positive for fatigue. Negative for fever.  HENT: Positive for congestion and postnasal drip. Negative for ear pain and sore throat.   Eyes: Positive for itching (occasional).  Respiratory: Negative for cough, shortness of breath and wheezing.     Cardiovascular: Negative for chest pain, palpitations and leg swelling.  Gastrointestinal:       No GERD  Neurological: Positive for light-headedness (occasional) and headaches.       Objective:   Filed Vitals:   02/19/16 1521  BP: 124/76  Pulse: 67  Temp: 98.3 F (36.8 C)  Resp: 16   Filed Weights   02/19/16 1521  Weight: 157 lb (71.215 kg)   Body mass index is 22.2 kg/(m^2).   Physical Exam Constitutional: Appears well-developed and well-nourished. No distress.  Neck: Neck supple. No tracheal deviation present. No thyromegaly present.  No carotid bruit. No cervical adenopathy.   Cardiovascular: Normal rate, regular rhythm and normal heart sounds.   No murmur heard.  No edema Pulmonary/Chest: Effort normal and breath sounds normal. No respiratory distress. No wheezes.       Assessment & Plan:    Headaches: Likely related to allergies since he tends to get them at this time of year Recent eye exam normal No other obvious cause such as dehydration, fatigue, etc. Advised him to monitor for a pattern He does have some other allergy symptoms and I recommended that he start taking loratadine daily to see if that helps Okay to take Tylenol as needed-avoid daily use  Concern for risk for heart disease, hyperlipidemia Given his risk factors and lack of symptoms with regular exercise and do not recommend a stress test He does have a significant family history and we did discuss that Can refer to cardiology. Wishes to have a second opinion-he will think about this Continue regular exercise and healthy diet Follow-up if he has any concerning symptoms such as chest pain, palpitations, shortness of breath and lightheadedness, especially with exercise  See Problem List for Assessment and Plan of chronic medical problems.   Follow up annually for PE

## 2016-05-15 ENCOUNTER — Encounter: Payer: Self-pay | Admitting: Internal Medicine

## 2016-07-05 ENCOUNTER — Telehealth: Payer: Self-pay | Admitting: Internal Medicine

## 2016-07-05 NOTE — Telephone Encounter (Signed)
Pt works at [employer] and he was wondering if Dr. Quay Burow can tell him what all the lab work she need for a physical on 07/10/16? He get to done for free at [employer] and he wants to do it before he comes for the appt. Please advise and call pt back

## 2016-07-05 NOTE — Telephone Encounter (Signed)
Please advise 

## 2016-07-06 NOTE — Telephone Encounter (Signed)
Cbc, cmp, tsh, lipid panel, psa

## 2016-07-08 NOTE — Telephone Encounter (Signed)
LVM informing pt. Pt will call back if he needs orders sent to [facility].

## 2016-07-10 ENCOUNTER — Encounter: Payer: Self-pay | Admitting: Internal Medicine

## 2016-07-22 ENCOUNTER — Encounter: Payer: Self-pay | Admitting: Internal Medicine

## 2016-08-21 ENCOUNTER — Encounter: Payer: Self-pay | Admitting: Internal Medicine

## 2016-09-02 ENCOUNTER — Encounter: Payer: Self-pay | Admitting: Internal Medicine

## 2016-10-07 ENCOUNTER — Encounter: Payer: Self-pay | Admitting: Internal Medicine

## 2016-11-06 ENCOUNTER — Other Ambulatory Visit: Payer: 59

## 2016-11-06 ENCOUNTER — Encounter: Payer: Self-pay | Admitting: Internal Medicine

## 2016-11-06 ENCOUNTER — Ambulatory Visit (INDEPENDENT_AMBULATORY_CARE_PROVIDER_SITE_OTHER): Payer: 59 | Admitting: Internal Medicine

## 2016-11-06 VITALS — BP 114/78 | HR 61 | Temp 98.0°F | Resp 16 | Ht 71.0 in | Wt 157.0 lb

## 2016-11-06 DIAGNOSIS — R739 Hyperglycemia, unspecified: Secondary | ICD-10-CM

## 2016-11-06 DIAGNOSIS — E7800 Pure hypercholesterolemia, unspecified: Secondary | ICD-10-CM

## 2016-11-06 DIAGNOSIS — K219 Gastro-esophageal reflux disease without esophagitis: Secondary | ICD-10-CM

## 2016-11-06 DIAGNOSIS — R7303 Prediabetes: Secondary | ICD-10-CM | POA: Insufficient documentation

## 2016-11-06 DIAGNOSIS — Z0000 Encounter for general adult medical examination without abnormal findings: Secondary | ICD-10-CM | POA: Diagnosis not present

## 2016-11-06 DIAGNOSIS — N400 Enlarged prostate without lower urinary tract symptoms: Secondary | ICD-10-CM

## 2016-11-06 NOTE — Assessment & Plan Note (Addendum)
Possible atypical GERD Discussed GERD diet tums or zantac as needed or daily if needed

## 2016-11-06 NOTE — Patient Instructions (Addendum)
Test(s) ordered today. Your results will be released to MyChart (or called to you) after review, usually within 72hours after test completion. If any changes need to be made, you will be notified at that same time.  All other Health Maintenance issues reviewed.   All recommended immunizations and age-appropriate screenings are up-to-date or discussed.  No immunizations administered today.   Medications reviewed and updated.  No changes recommended at this time.   Please followup in one year    Health Maintenance, Male A healthy lifestyle and preventative care can promote health and wellness.  Maintain regular health, dental, and eye exams.  Eat a healthy diet. Foods like vegetables, fruits, whole grains, low-fat dairy products, and lean protein foods contain the nutrients you need and are low in calories. Decrease your intake of foods high in solid fats, added sugars, and salt. Get information about a proper diet from your health care provider, if necessary.  Regular physical exercise is one of the most important things you can do for your health. Most adults should get at least 150 minutes of moderate-intensity exercise (any activity that increases your heart rate and causes you to sweat) each week. In addition, most adults need muscle-strengthening exercises on 2 or more days a week.   Maintain a healthy weight. The body mass index (BMI) is a screening tool to identify possible weight problems. It provides an estimate of body fat based on height and weight. Your health care provider can find your BMI and can help you achieve or maintain a healthy weight. For males 20 years and older:  A BMI below 18.5 is considered underweight.  A BMI of 18.5 to 24.9 is normal.  A BMI of 25 to 29.9 is considered overweight.  A BMI of 30 and above is considered obese.  Maintain normal blood lipids and cholesterol by exercising and minimizing your intake of saturated fat. Eat a balanced diet with  plenty of fruits and vegetables. Blood tests for lipids and cholesterol should begin at age 20 and be repeated every 5 years. If your lipid or cholesterol levels are high, you are over age 50, or you are at high risk for heart disease, you may need your cholesterol levels checked more frequently.Ongoing high lipid and cholesterol levels should be treated with medicines if diet and exercise are not working.  If you smoke, find out from your health care provider how to quit. If you do not use tobacco, do not start.  Lung cancer screening is recommended for adults aged 55-80 years who are at high risk for developing lung cancer because of a history of smoking. A yearly low-dose CT scan of the lungs is recommended for people who have at least a 30-pack-year history of smoking and are current smokers or have quit within the past 15 years. A pack year of smoking is smoking an average of 1 pack of cigarettes a day for 1 year (for example, a 30-pack-year history of smoking could mean smoking 1 pack a day for 30 years or 2 packs a day for 15 years). Yearly screening should continue until the smoker has stopped smoking for at least 15 years. Yearly screening should be stopped for people who develop a health problem that would prevent them from having lung cancer treatment.  If you choose to drink alcohol, do not have more than 2 drinks per day. One drink is considered to be 12 oz (360 mL) of beer, 5 oz (150 mL) of wine, or 1.5 oz (  45 mL) of liquor.  Avoid the use of street drugs. Do not share needles with anyone. Ask for help if you need support or instructions about stopping the use of drugs.  High blood pressure causes heart disease and increases the risk of stroke. High blood pressure is more likely to develop in:  People who have blood pressure in the end of the normal range (100-139/85-89 mm Hg).  People who are overweight or obese.  People who are African American.  If you are 18-39 years of age, have  your blood pressure checked every 3-5 years. If you are 40 years of age or older, have your blood pressure checked every year. You should have your blood pressure measured twice-once when you are at a hospital or clinic, and once when you are not at a hospital or clinic. Record the average of the two measurements. To check your blood pressure when you are not at a hospital or clinic, you can use:  An automated blood pressure machine at a pharmacy.  A home blood pressure monitor.  If you are 45-79 years old, ask your health care provider if you should take aspirin to prevent heart disease.  Diabetes screening involves taking a blood sample to check your fasting blood sugar level. This should be done once every 3 years after age 45 if you are at a normal weight and without risk factors for diabetes. Testing should be considered at a younger age or be carried out more frequently if you are overweight and have at least 1 risk factor for diabetes.  Colorectal cancer can be detected and often prevented. Most routine colorectal cancer screening begins at the age of 50 and continues through age 75. However, your health care provider may recommend screening at an earlier age if you have risk factors for colon cancer. On a yearly basis, your health care provider may provide home test kits to check for hidden blood in the stool. A small camera at the end of a tube may be used to directly examine the colon (sigmoidoscopy or colonoscopy) to detect the earliest forms of colorectal cancer. Talk to your health care provider about this at age 50 when routine screening begins. A direct exam of the colon should be repeated every 5-10 years through age 75, unless early forms of precancerous polyps or small growths are found.  People who are at an increased risk for hepatitis B should be screened for this virus. You are considered at high risk for hepatitis B if:  You were born in a country where hepatitis B occurs often.  Talk with your health care provider about which countries are considered high risk.  Your parents were born in a high-risk country and you have not received a shot to protect against hepatitis B (hepatitis B vaccine).  You have HIV or AIDS.  You use needles to inject street drugs.  You live with, or have sex with, someone who has hepatitis B.  You are a man who has sex with other men (MSM).  You get hemodialysis treatment.  You take certain medicines for conditions like cancer, organ transplantation, and autoimmune conditions.  Hepatitis C blood testing is recommended for all people born from 1945 through 1965 and any individual with known risk factors for hepatitis C.  Healthy men should no longer receive prostate-specific antigen (PSA) blood tests as part of routine cancer screening. Talk to your health care provider about prostate cancer screening.  Testicular cancer screening is not recommended for   adolescents or adult males who have no symptoms. Screening includes self-exam, a health care provider exam, and other screening tests. Consult with your health care provider about any symptoms you have or any concerns you have about testicular cancer.  Practice safe sex. Use condoms and avoid high-risk sexual practices to reduce the spread of sexually transmitted infections (STIs).  You should be screened for STIs, including gonorrhea and chlamydia if:  You are sexually active and are younger than 24 years.  You are older than 24 years, and your health care provider tells you that you are at risk for this type of infection.  Your sexual activity has changed since you were last screened, and you are at an increased risk for chlamydia or gonorrhea. Ask your health care provider if you are at risk.  If you are at risk of being infected with HIV, it is recommended that you take a prescription medicine daily to prevent HIV infection. This is called pre-exposure prophylaxis (PrEP). You are  considered at risk if:  You are a man who has sex with other men (MSM).  You are a heterosexual man who is sexually active with multiple partners.  You take drugs by injection.  You are sexually active with a partner who has HIV.  Talk with your health care provider about whether you are at high risk of being infected with HIV. If you choose to begin PrEP, you should first be tested for HIV. You should then be tested every 3 months for as long as you are taking PrEP.  Use sunscreen. Apply sunscreen liberally and repeatedly throughout the day. You should seek shade when your shadow is shorter than you. Protect yourself by wearing long sleeves, pants, a wide-brimmed hat, and sunglasses year round whenever you are outdoors.  Tell your health care provider of new moles or changes in moles, especially if there is a change in shape or color. Also, tell your health care provider if a mole is larger than the size of a pencil eraser.  A one-time screening for abdominal aortic aneurysm (AAA) and surgical repair of large AAAs by ultrasound is recommended for men aged 65-75 years who are current or former smokers.  Stay current with your vaccines (immunizations). This information is not intended to replace advice given to you by your health care provider. Make sure you discuss any questions you have with your health care provider. Document Released: 04/25/2008 Document Revised: 11/18/2014 Document Reviewed: 08/01/2015 Elsevier Interactive Patient Education  2017 [employer]  

## 2016-11-06 NOTE — Assessment & Plan Note (Signed)
Controlled with lifestyle Check lipid panel

## 2016-11-06 NOTE — Assessment & Plan Note (Signed)
a1c 5.7% in past, mom has DM, dad had DM Will check a1c Low sugar/carb diet, keep u with regular exercise, keep weight down

## 2016-11-06 NOTE — Progress Notes (Signed)
Subjective:    Patient ID: Jeremy Ellis, male    DOB: 1961-08-18, 55 y.o.   MRN: [id number]  HPI He is here for a physical exam.   His sugar has been around 100 when he checks it with his mom's glucometer.  He does not always monitor his sugars/carbs.  He works out regularly.  He goes to the gym 4 days a week typically.  He does resistance and cardio.    Occasionally he has burning in his chest.  Dr Linna Darner diagnosed him with GERD.  His dad, mom  and sister have GERD and his dad a had what sounds like an esophageal stricture.  He has not noticed any relation to certain foods, but has not paid attention.   Medications and allergies reviewed with patient and updated if appropriate.  Patient Active Problem List   Diagnosis Date Noted  . Hyperglycemia 11/06/2016  . Raynaud's phenomenon 06/01/2015  . Personal history of colonic polyps 03/14/2014  . ILEITIS 01/24/2011  . DIZZINESS 01/31/2010  . GERD 09/15/2009  . Hyperlipidemia 03/21/2009  . BPH (benign prostatic hyperplasia) 03/18/2008  . HERNIATED DISC 12/11/2006    Current Outpatient Prescriptions on File Prior to Visit  Medication Sig Dispense Refill  . Cholecalciferol (VITAMIN D3) 400 UNITS CAPS Take by mouth daily.    Marland Kitchen ECHINACEA PO Take by mouth 2 (two) times daily as needed. Reported on 11/02/2015    . Multiple Vitamin (MULTIVITAMIN) tablet Take 1 tablet by mouth daily.      . Omega-3 Fatty Acids (FISH OIL) 1000 MG CAPS Take by mouth. 300 mg of Omega 3 included, 1 by mouth twice daily    . Probiotic Product (PROBIOTIC DAILY PO) Take 1 tablet by mouth as needed.     . Saw Palmetto 450 MG CAPS Take by mouth. 1 by mouth two times daily    . vitamin C (ASCORBIC ACID) 500 MG tablet Take 500 mg by mouth daily.     No current facility-administered medications on file prior to visit.     Past Medical History:  Diagnosis Date  . Hyperlipidemia     Past Surgical History:  Procedure Laterality Date  . COLONOSCOPY  2002      Ileitis on Biospy, Dr.Perry  . colonoscopy with polypectomy  12/20/11   sessile serrated polyp    Social History   Social History  . Marital status: Single    Spouse name: N/A  . Number of children: N/A  . Years of education: N/A   Social History Main Topics  . Smoking status: Never Smoker  . Smokeless tobacco: Never Used  . Alcohol use 0.6 oz/week    1 Glasses of wine per week     Comment: Red wine 3-4x monthly    . Drug use: No  . Sexual activity: Not Asked   Other Topics Concern  . None   Social History Narrative   Exercise - 5 days a week - cardio for 30-40 minutes    Family History  Problem Relation Age of Onset  . Colon cancer Paternal Grandmother 24  . Stroke Father 1    hemorrhagic  . Heart failure Father   . Heart attack Father 47  . Diabetes Father   . Hyperlipidemia Mother   . Diabetes Mother   . Other Mother     giant cell arteritis  . Heart attack Paternal Grandfather 33  . Heart attack Maternal Grandfather     in 32s  . Diabetes  Maternal Grandmother   . Alzheimer's disease Maternal Grandmother   . Heart failure Daughter   . Stomach cancer Neg Hx   . Asthma Neg Hx   . COPD Neg Hx     Review of Systems  Constitutional: Negative for appetite change, chills, fatigue, fever and unexpected weight change.  Eyes: Negative for visual disturbance.  Respiratory: Negative for cough, shortness of breath and wheezing.   Cardiovascular: Negative for chest pain, palpitations and leg swelling.  Gastrointestinal: Negative for abdominal pain, blood in stool, constipation, diarrhea and nausea.  Genitourinary: Negative for dysuria and hematuria.  Musculoskeletal: Negative for arthralgias and back pain.  Skin: Negative for color change and rash.  Neurological: Positive for headaches (occ, sinus headaches). Negative for light-headedness.  Psychiatric/Behavioral: Negative for dysphoric mood. The patient is not nervous/anxious.        Objective:   Vitals:    11/06/16 0905  BP: 114/78  Pulse: 61  Resp: 16  Temp: 98 F (36.7 C)   Filed Weights   11/06/16 0905  Weight: 157 lb (71.2 kg)   Body mass index is 21.9 kg/m.   Physical Exam Constitutional: He appears well-developed and well-nourished. No distress.  HENT:  Head: Normocephalic and atraumatic.  Right Ear: External ear normal.  Left Ear: External ear normal.  Mouth/Throat: Oropharynx is clear and moist.  Normal ear canals and TM b/l  Eyes: Conjunctivae and EOM are normal.  Neck: Neck supple. No tracheal deviation present. No thyromegaly present.  No carotid bruit  Cardiovascular: Normal rate, regular rhythm, normal heart sounds and intact distal pulses.   No murmur heard. Pulmonary/Chest: Effort normal and breath sounds normal. No respiratory distress. He has no wheezes. He has no rales.  Abdominal: Soft. Bowel sounds are normal. He exhibits no distension. There is no tenderness.  Genitourinary: deferred to urology Musculoskeletal: He exhibits no edema.  Lymphadenopathy:    He has no cervical adenopathy.  Skin: Skin is warm and dry. He is not diaphoretic.  Psychiatric: He has a normal mood and affect. His behavior is normal.         Assessment & Plan:   Physical exam: Screening blood work   ordered  Immunizations   Up to date  Colonoscopy  Up to date  Eye exams  Up to date  EKG - none 2015 Exercise  - regular Weight  - normal BMI Skin  - no concerns Substance abuse  - none  See Problem List for Assessment and Plan of chronic medical problems.  FU annually for a CPE

## 2016-11-06 NOTE — Progress Notes (Signed)
Pre visit review using our clinic review tool, if applicable. No additional management support is needed unless otherwise documented below in the visit note. 

## 2016-11-06 NOTE — Assessment & Plan Note (Signed)
Following with urology

## 2016-11-07 LAB — LIPID PANEL
CHOL/HDL RATIO: 3 ratio (ref 0.0–5.0)
CHOLESTEROL TOTAL: 156 mg/dL (ref 100–199)
HDL: 52 mg/dL (ref >39)
LDL CALC: 94 mg/dL (ref 0–99)
Triglycerides: 49 mg/dL (ref 0–149)
VLDL Cholesterol Cal: 10 mg/dL (ref 5–40)

## 2016-11-07 LAB — COMPREHENSIVE METABOLIC PANEL
ALBUMIN: 4.4 g/dL (ref 3.5–5.5)
ALK PHOS: 54 IU/L (ref 39–117)
ALT: 16 IU/L (ref 0–44)
AST: 24 IU/L (ref 0–40)
Albumin/Globulin Ratio: 2.3 — ABNORMAL HIGH (ref 1.2–2.2)
BUN / CREAT RATIO: 19 (ref 9–20)
BUN: 20 mg/dL (ref 6–24)
Bilirubin Total: 0.6 mg/dL (ref 0.0–1.2)
CO2: 25 mmol/L (ref 18–29)
CREATININE: 1.04 mg/dL (ref 0.76–1.27)
Calcium: 9.4 mg/dL (ref 8.7–10.2)
Chloride: 103 mmol/L (ref 96–106)
GFR calc non Af Amer: 79 mL/min/{1.73_m2} (ref >59)
GFR, EST AFRICAN AMERICAN: 92 mL/min/{1.73_m2} (ref >59)
Globulin, Total: 1.9 g/dL (ref 1.5–4.5)
Glucose: 89 mg/dL (ref 65–99)
Potassium: 4.7 mmol/L (ref 3.5–5.2)
SODIUM: 143 mmol/L (ref 134–144)
TOTAL PROTEIN: 6.3 g/dL (ref 6.0–8.5)

## 2016-11-07 LAB — HEMOGLOBIN A1C
Est. average glucose Bld gHb Est-mCnc: 111 mg/dL
Hgb A1c MFr Bld: 5.5 % (ref 4.8–5.6)

## 2016-11-07 LAB — TSH: TSH: 3.11 u[IU]/mL (ref 0.450–4.500)

## 2016-11-07 LAB — PSA, TOTAL AND FREE
PROSTATE SPECIFIC AG, SERUM: 0.7 ng/mL (ref 0.0–4.0)
PSA FREE: 0.27 ng/mL
PSA, Free Pct: 38.6 %

## 2016-11-10 ENCOUNTER — Encounter: Payer: Self-pay | Admitting: Internal Medicine

## 2016-11-10 DIAGNOSIS — N400 Enlarged prostate without lower urinary tract symptoms: Secondary | ICD-10-CM

## 2016-11-20 ENCOUNTER — Encounter: Payer: Self-pay | Admitting: Internal Medicine

## 2016-11-29 ENCOUNTER — Encounter: Payer: Self-pay | Admitting: Internal Medicine

## 2016-12-03 ENCOUNTER — Encounter: Payer: Self-pay | Admitting: Internal Medicine

## 2016-12-04 ENCOUNTER — Encounter: Payer: Self-pay | Admitting: Internal Medicine

## 2016-12-04 ENCOUNTER — Encounter: Payer: Self-pay | Admitting: Family Medicine

## 2016-12-04 ENCOUNTER — Ambulatory Visit: Payer: 59 | Admitting: Family Medicine

## 2016-12-05 ENCOUNTER — Ambulatory Visit (INDEPENDENT_AMBULATORY_CARE_PROVIDER_SITE_OTHER): Payer: 59 | Admitting: Internal Medicine

## 2016-12-05 ENCOUNTER — Encounter: Payer: Self-pay | Admitting: Internal Medicine

## 2016-12-05 DIAGNOSIS — R69 Illness, unspecified: Secondary | ICD-10-CM | POA: Diagnosis not present

## 2016-12-05 DIAGNOSIS — J111 Influenza due to unidentified influenza virus with other respiratory manifestations: Secondary | ICD-10-CM

## 2016-12-05 MED ORDER — OSELTAMIVIR PHOSPHATE 75 MG PO CAPS: 75.0000 mg | ORAL_CAPSULE | Freq: Two times a day (BID) | ORAL | 0 refills | Status: DC

## 2016-12-05 MED ORDER — ONDANSETRON HCL 4 MG PO TABS: 4.0000 mg | ORAL_TABLET | Freq: Three times a day (TID) | ORAL | 0 refills | Status: DC | PRN

## 2016-12-05 NOTE — Progress Notes (Signed)
   Subjective:    Patient ID: Jeremy Ellis, male    DOB: [birth year], 56 y.o.   MRN: [id number]  HPI  Here with flu like onset symptoms x 2 days with tmax 100.7 last PM, with head congestion, HA, myalgias, sinus congestion and scant prod cough clear sputum, nausea and several chills.  Pt denies new neurological symptoms such as new headache, or facial or extremity weakness or numbness'   Pt denies polydipsia, polyuria Past Medical History:  Diagnosis Date  . Hyperlipidemia    Past Surgical History:  Procedure Laterality Date  . COLONOSCOPY  2002    Ileitis on Biospy, Dr.Perry  . colonoscopy with polypectomy  12/20/11   sessile serrated polyp    reports that he has never smoked. He has never used smokeless tobacco. He reports that he drinks about 0.6 oz of alcohol per week . He reports that he does not use drugs. family history includes Alzheimer's disease in his maternal grandmother; Colon cancer (age of onset: 28) in his paternal grandmother; Diabetes in his father, maternal grandmother, and mother; Heart attack in his maternal grandfather; Heart attack (age of onset: 61) in his father; Heart attack (age of onset: 50) in his paternal grandfather; Heart failure in his daughter and father; Hyperlipidemia in his mother; Other in his mother; Stroke (age of onset: 59) in his father. Allergies  Allergen Reactions  . Erythromycin Swelling  . Penicillins Hives and Swelling  . Sulfonamide Derivatives Hives and Swelling   Current Outpatient Prescriptions on File Prior to Visit  Medication Sig Dispense Refill  . Cholecalciferol (VITAMIN D3) 400 UNITS CAPS Take by mouth daily.    Marland Kitchen ECHINACEA PO Take by mouth 2 (two) times daily as needed. Reported on 11/02/2015    . Multiple Vitamin (MULTIVITAMIN) tablet Take 1 tablet by mouth daily.      . Omega-3 Fatty Acids (FISH OIL) 1000 MG CAPS Take by mouth. 300 mg of Omega 3 included, 1 by mouth twice daily    . Probiotic Product (PROBIOTIC DAILY PO)  Take 1 tablet by mouth as needed.     . Saw Palmetto 450 MG CAPS Take by mouth. 1 by mouth two times daily    . vitamin C (ASCORBIC ACID) 500 MG tablet Take 500 mg by mouth daily.     No current facility-administered medications on file prior to visit.    Review of Systems All otherwise neg per pt    Objective:   Physical Exam BP 132/72   Pulse 87   Temp 99.3 F (37.4 C) (Oral)   Resp 20   Wt 159 lb (72.1 kg)   SpO2 96%   BMI 22.18 kg/m  VS noted, mild ill Constitutional: Pt appears in no apparent distress HENT: Head: NCAT.  Right Ear: External ear normal.  Left Ear: External ear normal.  Eyes: . Pupils are equal, round, and reactive to light. Conjunctivae and EOM are normal Bilat tm's with mild erythema.  Max sinus areas mild tender.  Pharynx with mild erythema, no exudate Neck: Normal range of motion. Neck supple.  Cardiovascular: Normal rate and regular rhythm.   Pulmonary/Chest: Effort normal and breath sounds without rales or wheezing.  Neurological: Pt is alert. Not confused , motor grossly intact Skin: Skin is warm. No rash, no LE edema Psychiatric: Pt behavior is normal. No agitation. mild nervous No other new exam findings    Assessment & Plan:

## 2016-12-05 NOTE — Patient Instructions (Signed)
Please take all new medication as prescribed - the tamiflu, and the nausea medication if needed  Please continue all other medications as before, and refills have been done if requested.  Please have the pharmacy call with any other refills you may need.  Please keep your appointments with your specialists as you may have planned

## 2016-12-05 NOTE — Assessment & Plan Note (Addendum)
Per current local recommendations , will tx as influenza without testing as not felt to be reliable this yr, tx empirically weith tamiflu asd, zofran for nausea

## 2016-12-05 NOTE — Progress Notes (Signed)
Pre visit review using our clinic review tool, if applicable. No additional management support is needed unless otherwise documented below in the visit note. 

## 2016-12-09 ENCOUNTER — Emergency Department (HOSPITAL_COMMUNITY)
Admission: EM | Admit: 2016-12-09 | Discharge: 2016-12-09 | Disposition: A | Discharge status: Home / Self Care | Payer: 59 | Attending: Emergency Medicine | Admitting: Emergency Medicine

## 2016-12-09 ENCOUNTER — Telehealth: Payer: Self-pay | Admitting: Gastroenterology

## 2016-12-09 ENCOUNTER — Other Ambulatory Visit: Payer: 59

## 2016-12-09 ENCOUNTER — Encounter (HOSPITAL_COMMUNITY): Payer: Self-pay | Admitting: Emergency Medicine

## 2016-12-09 ENCOUNTER — Other Ambulatory Visit: Payer: Self-pay

## 2016-12-09 DIAGNOSIS — B349 Viral infection, unspecified: Secondary | ICD-10-CM

## 2016-12-09 DIAGNOSIS — R197 Diarrhea, unspecified: Secondary | ICD-10-CM

## 2016-12-09 DIAGNOSIS — Z79899 Other long term (current) drug therapy: Secondary | ICD-10-CM | POA: Insufficient documentation

## 2016-12-09 DIAGNOSIS — E860 Dehydration: Secondary | ICD-10-CM | POA: Diagnosis not present

## 2016-12-09 DIAGNOSIS — H6123 Impacted cerumen, bilateral: Secondary | ICD-10-CM | POA: Diagnosis not present

## 2016-12-09 LAB — COMPREHENSIVE METABOLIC PANEL
ALK PHOS: 44 U/L (ref 38–126)
ALT: 22 U/L (ref 17–63)
ANION GAP: 10 (ref 5–15)
AST: 27 U/L (ref 15–41)
Albumin: 4.2 g/dL (ref 3.5–5.0)
BILIRUBIN TOTAL: 0.7 mg/dL (ref 0.3–1.2)
BUN: 14 mg/dL (ref 6–20)
CALCIUM: 9 mg/dL (ref 8.9–10.3)
CO2: 25 mmol/L (ref 22–32)
CREATININE: 1.2 mg/dL (ref 0.61–1.24)
Chloride: 100 mmol/L — ABNORMAL LOW (ref 101–111)
GFR calc non Af Amer: >60 mL/min (ref >60)
Glucose, Bld: 113 mg/dL — ABNORMAL HIGH (ref 65–99)
Potassium: 4.2 mmol/L (ref 3.5–5.1)
Sodium: 135 mmol/L (ref 135–145)
TOTAL PROTEIN: 7.1 g/dL (ref 6.5–8.1)

## 2016-12-09 LAB — CBC
HCT: 41.6 % (ref 39.0–52.0)
HEMOGLOBIN: 15 g/dL (ref 13.0–17.0)
MCH: 32.5 pg (ref 26.0–34.0)
MCHC: 36.1 g/dL — AB (ref 30.0–36.0)
MCV: 90.2 fL (ref 78.0–100.0)
PLATELETS: 123 10*3/uL — AB (ref 150–400)
RBC: 4.61 MIL/uL (ref 4.22–5.81)
RDW: 11.7 % (ref 11.5–15.5)
WBC: 4.1 10*3/uL (ref 4.0–10.5)

## 2016-12-09 LAB — URINALYSIS, ROUTINE W REFLEX MICROSCOPIC
BILIRUBIN URINE: NEGATIVE
Glucose, UA: NEGATIVE mg/dL
HGB URINE DIPSTICK: NEGATIVE
Ketones, ur: 20 mg/dL — AB
Leukocytes, UA: NEGATIVE
NITRITE: NEGATIVE
PROTEIN: NEGATIVE mg/dL
SPECIFIC GRAVITY, URINE: 1.02 (ref 1.005–1.030)
pH: 5 (ref 5.0–8.0)

## 2016-12-09 LAB — LIPASE, BLOOD: Lipase: 20 U/L (ref 11–51)

## 2016-12-09 MED ORDER — SODIUM CHLORIDE 0.9 % IV BOLUS (SEPSIS)
1000.0000 mL | Freq: Once | INTRAVENOUS | Status: AC
  Administered 2016-12-09: 1000 mL via INTRAVENOUS

## 2016-12-09 MED ORDER — PROMETHAZINE HCL 25 MG PO TABS: 25.0000 mg | ORAL_TABLET | Freq: Four times a day (QID) | ORAL | 0 refills | Status: DC | PRN

## 2016-12-09 MED ORDER — PROMETHAZINE HCL 25 MG/ML IJ SOLN
12.5000 mg | Freq: Once | INTRAMUSCULAR | Status: AC
  Administered 2016-12-09: 12.5 mg via INTRAVENOUS
  Filled 2016-12-09: qty 1

## 2016-12-09 NOTE — ED Triage Notes (Signed)
Patient reports right ear pain, headache, and sore throat starting Wednesday. Temperature was 100.7 on Wednesday. Patient was prescribed Tamiflu and zofran by PCP on Thursday. Patient only took zofran. Patient no longer complaining of fever. Patient continues to be nausea and diarrhea. Patient completed stool sample today at gastroenterologist.

## 2016-12-09 NOTE — Telephone Encounter (Signed)
Patient called after hours line on Sunday night - he has had some URI symptoms earlier this week, then developed worsening nausea accompanied by diarrhea today. Had low grade temp on Wed but no fevers since then. Taking zofran for nausea which helps. No risk factors for C Diff, no recent antibiotic use. May be viral syndrome. Recommended using gatorade or pedialyte to maintain hydration and try some immodium PRN for diarrhea. If no benefit or worsening I asked him to call the clinic today.  Denice Bors, I think this patient is yours.   Richardson Landry

## 2016-12-09 NOTE — Telephone Encounter (Signed)
Nothing new. If still having problems week see extender

## 2016-12-09 NOTE — ED Provider Notes (Signed)
[facility] DEPT Provider Note   CSN: [id number] Arrival date & time: 12/09/16  0848     History   Chief Complaint Chief Complaint  Patient presents with  . Diarrhea    HPI Jeremy Ellis is a 56 y.o. male.  HPI   56 year old male with history of hyperlipidemia, GERD, BPH presenting today with complaints of flulike symptoms. Patient reports 5 days ago he developed decrease in appetite, myalgias, elevated temperature as high as 100.7, nasal congestion, sneezing, coughing. The next several days, he reports decrease in appetite, dry heaving, and having explosive nonbloody but mucousy diarrhea. Reported 5-7 bouts of diarrhea per day. He feels lightheadedness, dizzy, and dehydrated with an appetite. He was seen by his PCP several days prior for this complaints and it was felt that it is likely flulike symptoms. He was prescribed Zofran, and Tamiflu but decided not to take the Tamiflu and takes Zofran only. Zofran did help with his nausea but he continues to endorse decreased appetite. He was seen by his gastroenterologist yesterday for the same complaint, and a stool sample was obtained. He has not heard the results yet. He is here today with concerns for dehydration and lack of appetite. He denies shortness of breath, productive cough, dysuria, hematuria, hematochezia or melena. No recent travel, no recent antibiotic use, denies eating any exotic food. He did not have a flu shot this year. He had a colonoscopy 5 years ago.  Past Medical History:  Diagnosis Date  . Hyperlipidemia     Patient Active Problem List   Diagnosis Date Noted  . Influenza-like illness 12/05/2016  . Hyperglycemia 11/06/2016  . Raynaud's phenomenon 06/01/2015  . Personal history of colonic polyps 03/14/2014  . ILEITIS 01/24/2011  . GERD 09/15/2009  . Hyperlipidemia 03/21/2009  . BPH (benign prostatic hyperplasia) 03/18/2008  . HERNIATED DISC 12/11/2006    Past Surgical History:  Procedure Laterality  Date  . COLONOSCOPY  2002    Ileitis on Biospy, Dr.Perry  . colonoscopy with polypectomy  12/20/11   sessile serrated polyp       Home Medications    Prior to Admission medications   Medication Sig Start Date End Date Taking? Authorizing Provider  Cholecalciferol (VITAMIN D3) 400 UNITS CAPS Take by mouth daily.    Historical Provider, MD  ECHINACEA PO Take by mouth 2 (two) times daily as needed. Reported on 11/02/2015    Historical Provider, MD  Multiple Vitamin (MULTIVITAMIN) tablet Take 1 tablet by mouth daily.      Historical Provider, MD  Omega-3 Fatty Acids (FISH OIL) 1000 MG CAPS Take by mouth. 300 mg of Omega 3 included, 1 by mouth twice daily    Historical Provider, MD  ondansetron (ZOFRAN) 4 MG tablet Take 1 tablet (4 mg total) by mouth every 8 (eight) hours as needed for nausea or vomiting. 12/05/16   Biagio Borg, MD  oseltamivir (TAMIFLU) 75 MG capsule Take 1 capsule (75 mg total) by mouth 2 (two) times daily. 12/05/16   Biagio Borg, MD  Probiotic Product (PROBIOTIC DAILY PO) Take 1 tablet by mouth as needed.     Historical Provider, MD  Saw Palmetto 450 MG CAPS Take by mouth. 1 by mouth two times daily    Historical Provider, MD  vitamin C (ASCORBIC ACID) 500 MG tablet Take 500 mg by mouth daily.    Historical Provider, MD    Family History Family History  Problem Relation Age of Onset  . Colon cancer Paternal Grandmother 27  .  Stroke Father 49    hemorrhagic  . Heart failure Father   . Heart attack Father 35  . Diabetes Father   . Hyperlipidemia Mother   . Diabetes Mother   . Other Mother     giant cell arteritis  . Heart attack Paternal Grandfather 52  . Heart attack Maternal Grandfather     in 5s  . Diabetes Maternal Grandmother   . Alzheimer's disease Maternal Grandmother   . Heart failure Daughter   . Stomach cancer Neg Hx   . Asthma Neg Hx   . COPD Neg Hx     Social History Social History  Substance Use Topics  . Smoking status: Never Smoker  .  Smokeless tobacco: Never Used  . Alcohol use 0.6 oz/week    1 Glasses of wine per week     Comment: Red wine 3-4x monthly       Allergies   Erythromycin; Penicillins; and Sulfonamide derivatives   Review of Systems Review of Systems  All other systems reviewed and are negative.    Physical Exam Updated Vital Signs BP (!) 149/108 (BP Location: Left Arm)   Pulse 105   Temp 98.6 F (37 C) (Oral)   Resp 18   Ht 5\' 11"  (1.803 m)   Wt 72.1 kg   SpO2 95%   BMI 22.18 kg/m   Physical Exam  Constitutional: He is oriented to person, place, and time. He appears well-developed and well-nourished. No distress.  HENT:  Head: Atraumatic.  Ears: Cerumen impaction to both ears Nose: Normal nares Throat: Uvula is midline, no erythema. Mouth is dry  Eyes: Conjunctivae are normal.  Neck: Normal range of motion. Neck supple.  No nuchal rigidity  Cardiovascular:  Mild tachycardia without murmur rubs or gallops  Pulmonary/Chest: Effort normal and breath sounds normal.  Abdominal: Soft. Bowel sounds are normal. He exhibits no distension. There is no tenderness.  Neurological: He is alert and oriented to person, place, and time.  Skin: No rash noted.  Psychiatric: He has a normal mood and affect.  Nursing note and vitals reviewed.    ED Treatments / Results  Labs (all labs ordered are listed, but only abnormal results are displayed) Labs Reviewed  COMPREHENSIVE METABOLIC PANEL - Abnormal; Notable for the following:       Result Value   Chloride 100 (*)    Glucose, Bld 113 (*)    All other components within normal limits  CBC - Abnormal; Notable for the following:    MCHC 36.1 (*)    Platelets 123 (*)    All other components within normal limits  URINALYSIS, ROUTINE W REFLEX MICROSCOPIC - Abnormal; Notable for the following:    APPearance HAZY (*)    Ketones, ur 20 (*)    All other components within normal limits  LIPASE, BLOOD    EKG  EKG Interpretation None        Radiology No results found.  Procedures Procedures (including critical care time)  Medications Ordered in ED Medications - No data to display   Initial Impression / Assessment and Plan / ED Course  I have reviewed the triage vital signs and the nursing notes.  Pertinent labs & imaging results that were available during my care of the patient were reviewed by me and considered in my medical decision making (see chart for details).     BP 104/70 (BP Location: Right Arm)   Pulse 85   Temp 98.6 F (37 C) (Oral)  Resp 16   Ht 5\' 11"  (1.803 m)   Wt 72.1 kg   SpO2 96%   BMI 22.18 kg/m    Final Clinical Impressions(s) / ED Diagnoses   Final diagnoses:  Viral illness  Dehydration    New Prescriptions New Prescriptions   PROMETHAZINE (PHENERGAN) 25 MG TABLET    Take 1 tablet (25 mg total) by mouth every 6 (six) hours as needed for nausea or vomiting.   11:24 AM Patient presents with symptoms suggestive of viral illness. He does appear mildly dehydrated as evidenced by tachycardia, and dry mouth. Labs significant for 20 of ketones and urine however the remainder of his labs are reassuring. He has a benign abdomen on exam. Will provide symptomatically treatment including IV fluid, and antinausea medication.  1:57 PM After receiving IV fluid and antinausea medication. Patient states he feels much better. He is able to drink a small amount of fluid without vomiting. He is stable to be discharged. Encouraged gentle hydration, rest, and return precaution discussed. Will prescribe Phenergan as he can tolerate the medication better than Zofran.   Domenic Moras, PA-C 12/09/16 [place], MD 12/10/16 [phone]

## 2016-12-09 NOTE — Telephone Encounter (Signed)
Pt showed up in the lobby this am stating he was told to come in for labs and possible appt. No available appts today. Order placed for cdiff due to pt having diarrhea. He will go to the lab for cdiff test. Please advise of any other recommendations.

## 2016-12-09 NOTE — ED Notes (Signed)
RN contacted pharmacy to check on status of medication

## 2016-12-10 LAB — CLOSTRIDIUM DIFFICILE BY PCR: CDIFFPCR: NOT DETECTED

## 2016-12-13 ENCOUNTER — Ambulatory Visit: Payer: 59 | Admitting: Internal Medicine

## 2016-12-24 ENCOUNTER — Ambulatory Visit: Payer: Self-pay | Admitting: Internal Medicine

## 2016-12-24 NOTE — Progress Notes (Deleted)
Subjective:    Patient ID: Jeremy Ellis, male    DOB: 20-Oct-1961, 56 y.o.   MRN: [id number]  HPI The patient is here for follow up.  He was seen in the office 12/05/16 for flu like symptoms for two days.  He had a fever, chills, head congestion, HA, body aches, sinus congestion, nausea and minimally productive cough.  He was prescribed tamiflu and an anti-nuasea medication.  He did not take the tamiflu.   He went to the ED 12/09/16 for flu like symptoms - dec appetite, body aches, fever, nasal congestion, sneezing, cough.  He had decreased appetite, dry heaving, diarrhea, lightheadedness, dizzy, and dehydration. He received IVF and antinausea medication. He was given a prescription for phenergan.   Medications and allergies reviewed with patient and updated if appropriate.  Patient Active Problem List   Diagnosis Date Noted  . Influenza-like illness 12/05/2016  . Hyperglycemia 11/06/2016  . Raynaud's phenomenon 06/01/2015  . Personal history of colonic polyps 03/14/2014  . ILEITIS 01/24/2011  . GERD 09/15/2009  . Hyperlipidemia 03/21/2009  . BPH (benign prostatic hyperplasia) 03/18/2008  . HERNIATED DISC 12/11/2006    Current Outpatient Prescriptions on File Prior to Visit  Medication Sig Dispense Refill  . Cholecalciferol (VITAMIN D3) 400 UNITS CAPS Take by mouth daily.    Marland Kitchen ECHINACEA PO Take by mouth 2 (two) times daily as needed (immune support). Reported on 11/02/2015    . Multiple Vitamin (MULTIVITAMIN) tablet Take 1 tablet by mouth daily.      . Omega-3 Fatty Acids (FISH OIL) 1000 MG CAPS Take by mouth. 300 mg of Omega 3 included, 1 by mouth twice daily    . ondansetron (ZOFRAN) 4 MG tablet Take 1 tablet (4 mg total) by mouth every 8 (eight) hours as needed for nausea or vomiting. 20 tablet 0  . oseltamivir (TAMIFLU) 75 MG capsule Take 1 capsule (75 mg total) by mouth 2 (two) times daily. (Patient not taking: Reported on 12/09/2016) 10 capsule 0  . promethazine  (PHENERGAN) 25 MG tablet Take 1 tablet (25 mg total) by mouth every 6 (six) hours as needed for nausea or vomiting. 20 tablet 0  . Saw Palmetto 450 MG CAPS Take by mouth. 1 by mouth two times daily    . vitamin C (ASCORBIC ACID) 500 MG tablet Take 500 mg by mouth daily.     No current facility-administered medications on file prior to visit.     Past Medical History:  Diagnosis Date  . Hyperlipidemia     Past Surgical History:  Procedure Laterality Date  . COLONOSCOPY  2002    Ileitis on Biospy, Dr.Perry  . colonoscopy with polypectomy  12/20/11   sessile serrated polyp    Social History   Social History  . Marital status: Single    Spouse name: N/A  . Number of children: N/A  . Years of education: N/A   Social History Main Topics  . Smoking status: Never Smoker  . Smokeless tobacco: Never Used  . Alcohol use 0.6 oz/week    1 Glasses of wine per week     Comment: Red wine 3-4x monthly    . Drug use: No  . Sexual activity: Not on file   Other Topics Concern  . Not on file   Social History Narrative   Exercise - 5 days a week - cardio for 30-40 minutes    Family History  Problem Relation Age of Onset  . Colon cancer Paternal Grandmother  6  . Stroke Father 50    hemorrhagic  . Heart failure Father   . Heart attack Father 37  . Diabetes Father   . Hyperlipidemia Mother   . Diabetes Mother   . Other Mother     giant cell arteritis  . Heart attack Paternal Grandfather 15  . Heart attack Maternal Grandfather     in 9s  . Diabetes Maternal Grandmother   . Alzheimer's disease Maternal Grandmother   . Heart failure Daughter   . Stomach cancer Neg Hx   . Asthma Neg Hx   . COPD Neg Hx     Review of Systems     Objective:  There were no vitals filed for this visit. Wt Readings from Last 3 Encounters:  12/09/16 159 lb (72.1 kg)  12/05/16 159 lb (72.1 kg)  11/06/16 157 lb (71.2 kg)   There is no height or weight on file to calculate BMI.   Physical  Exam    Constitutional: Appears well-developed and well-nourished. No distress.  HENT:  Head: Normocephalic and atraumatic.  Neck: Neck supple. No tracheal deviation present. No thyromegaly present.  No cervical lymphadenopathy Cardiovascular: Normal rate, regular rhythm and normal heart sounds.   No murmur heard. No carotid bruit .  No edema Pulmonary/Chest: Effort normal and breath sounds normal. No respiratory distress. No has no wheezes. No rales.  Skin: Skin is warm and dry. Not diaphoretic.  Psychiatric: Normal mood and affect. Behavior is normal.      Assessment & Plan:    See Problem List for Assessment and Plan of chronic medical problems.

## 2017-01-14 ENCOUNTER — Ambulatory Visit (INDEPENDENT_AMBULATORY_CARE_PROVIDER_SITE_OTHER): Payer: 59 | Admitting: Internal Medicine

## 2017-01-14 ENCOUNTER — Other Ambulatory Visit: Payer: 59

## 2017-01-14 ENCOUNTER — Encounter: Payer: Self-pay | Admitting: Internal Medicine

## 2017-01-14 VITALS — BP 110/80 | HR 80 | Ht 71.0 in | Wt 157.0 lb

## 2017-01-14 DIAGNOSIS — R140 Abdominal distension (gaseous): Secondary | ICD-10-CM

## 2017-01-14 DIAGNOSIS — K219 Gastro-esophageal reflux disease without esophagitis: Secondary | ICD-10-CM | POA: Diagnosis not present

## 2017-01-14 DIAGNOSIS — Z86010 Personal history of colonic polyps: Secondary | ICD-10-CM

## 2017-01-14 MED ORDER — NA SULFATE-K SULFATE-MG SULF 17.5-3.13-1.6 GM/177ML PO SOLN: 1.0000 | Freq: Once | ORAL | 0 refills | Status: AC

## 2017-01-14 MED ORDER — METOCLOPRAMIDE HCL 10 MG PO TABS: ORAL_TABLET | ORAL | 0 refills | Status: DC

## 2017-01-14 NOTE — Patient Instructions (Signed)
You have been scheduled for a colonoscopy. Please follow written instructions given to you at your visit today.  Please pick up your prep supplies at the pharmacy within the next 1-3 days. If you use inhalers (even only as needed), please bring them with you on the day of your procedure. Your physician has requested that you go to www.startemmi.com and enter the access code given to you at your visit today. This web site gives a general overview about your procedure. However, you should still follow specific instructions given to you by our office regarding your preparation for the procedure.   We have sent the following medications to your pharmacy for you to pick up at your convenience:  Reglan   Take 1 tablet 20 minutes before drinking each half of the prep.  Your physician has requested that you go to the basement for the following lab work before leaving today:  TTG, IGA

## 2017-01-14 NOTE — Progress Notes (Signed)
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 Patient underwent index colonoscopy elsewhere 2002 which was negative. Last colonoscopy February 2013 here with sessile serrated polyp in the ascending colon. Internal hemorrhoids noted. Otherwise normal exam. Follow-up in 5 years recommended. He did receive a recall letter and is interested in scheduling. He does request medication for nausea to assist with his prep. Next, he reports a many month history of problems with abdominal bloating and gas. He typically has 2-3 formed bowel movements per day which is unchanged. Has had problems with gas in the past for which he took probiotic with uncertain affect. He does not identify any exacerbating or relieving factors. He does tell me that he eats steel cut oats daily which he feels helps his digestive tract. He does have minimal GERD symptoms in the morning without dysphagia. Does not require medical therapy. Finally, he does report some intermittent problems with hemorrhoids for which he states that his dietary nebulization has helped. He is on vitamins and supplements but no regular medications.  REVIEW OF SYSTEMS:  All non-GI ROS negative except for sinus allergy, anxiety, cough, fever, headaches, muscle pains, sore throat,  Past Medical History:  Diagnosis Date  . GERD (gastroesophageal reflux disease)   . Hyperlipidemia     Past Surgical History:  Procedure Laterality Date  . COLONOSCOPY  2002    Ileitis on Biospy, Dr.Harol Shabazz  . colonoscopy with polypectomy  12/20/11   sessile serrated polyp    Social History Jeremy Ellis  reports that he has never smoked. He has never used smokeless tobacco. He reports that he drinks about 0.6 oz of alcohol per week . He reports that he does not use  drugs.  family history includes Alzheimer's disease in his maternal grandmother; Colon cancer (age of onset: 23) in his paternal grandmother; Diabetes in his father, maternal grandmother, and mother; Heart attack in his maternal grandfather; Heart attack (age of onset: 72) in his father; Heart attack (age of onset: 37) in his paternal grandfather; Heart failure in his daughter and father; Hyperlipidemia in his mother; Other in his mother; Stroke (age of onset: 50) in his father.  Allergies  Allergen Reactions  . Erythromycin Swelling  . Penicillins Hives and Swelling    Has patient had a PCN reaction causing immediate rash, facial/tongue/throat swelling, SOB or lightheadedness with hypotension: Yes Has patient had a PCN reaction causing severe rash involving mucus membranes or skin necrosis: No Has patient had a PCN reaction that required hospitalization No Has patient had a PCN reaction occurring within the last 10 years: No If all of the above answers are "NO", then may proceed with Cephalosporin use.   . Sulfonamide Derivatives Hives and Swelling       PHYSICAL EXAMINATION: Vital signs: BP 110/80   Pulse 80   Ht 5\' 11"  (1.803 m)   Wt 157 lb (71.2 kg)   BMI 21.90 kg/m   Constitutional: generally well-appearing, no acute distress Psychiatric: alert and oriented x3, cooperative Eyes: extraocular movements intact, anicteric, conjunctiva pink Mouth: oral pharynx moist, no lesions Neck: supple no lymphadenopathy Cardiovascular: heart regular rate and rhythm, no murmur Lungs: clear to auscultation bilaterally Abdomen: soft, nontender, nondistended, no obvious ascites, no peritoneal signs, normal bowel sounds, no organomegaly Rectal:Deferred until colonoscopy Extremities: noclubbing cyanosis or  lower extremity  edema bilaterally Skin: no lesions on visible extremities Neuro: No focal deficits.Cranial nerves intact  ASSESSMENT:  #1. Increased intestinal gas without alarm features.  Screening for celiac has not been done #2. GERD without alarm features. Minimal #3. History of sessile serrated polyp. Due for surveillance #4. History of internal hemorrhoids.  PLAN:  #1. Discussion on intestinal gas. Etiologies and treatment strategies #2. Educational brochure on intestinal gas provided for his review #3. Recommended a trial of probiotic daily for several weeks #4. Reflux precautions #5. Screening laboratories for celiac disease with tissue transglutaminase antibody IgA and serum IgA level. We will contact him with the results when available #6. Schedule surveillance colonoscopy.The nature of the procedure, as well as the risks, benefits, and alternatives were carefully and thoroughly reviewed with the patient. Ample time for discussion and questions allowed. The patient understood, was satisfied, and agreed to proceed. #7. Prescribed metoclopramide 10 mg to be taken 20-30 minutes before each prep session. Also advised to take hard candy to help with nausea

## 2017-01-22 DIAGNOSIS — N400 Enlarged prostate without lower urinary tract symptoms: Secondary | ICD-10-CM | POA: Diagnosis not present

## 2017-03-13 ENCOUNTER — Encounter: Payer: Self-pay | Admitting: Internal Medicine

## 2017-03-27 ENCOUNTER — Encounter: Payer: 59 | Admitting: Internal Medicine

## 2017-04-21 ENCOUNTER — Encounter: Payer: Self-pay | Admitting: Internal Medicine

## 2017-05-15 ENCOUNTER — Other Ambulatory Visit: Payer: 59

## 2017-05-15 ENCOUNTER — Telehealth: Payer: Self-pay | Admitting: Internal Medicine

## 2017-05-15 ENCOUNTER — Encounter: Payer: Self-pay | Admitting: Internal Medicine

## 2017-05-15 ENCOUNTER — Other Ambulatory Visit: Payer: Self-pay

## 2017-05-15 ENCOUNTER — Ambulatory Visit (INDEPENDENT_AMBULATORY_CARE_PROVIDER_SITE_OTHER): Payer: 59 | Admitting: Internal Medicine

## 2017-05-15 VITALS — BP 118/68 | HR 65 | Temp 98.1°F | Resp 12 | Ht 71.0 in | Wt 156.0 lb

## 2017-05-15 DIAGNOSIS — R109 Unspecified abdominal pain: Secondary | ICD-10-CM

## 2017-05-15 DIAGNOSIS — J309 Allergic rhinitis, unspecified: Secondary | ICD-10-CM | POA: Diagnosis not present

## 2017-05-15 DIAGNOSIS — L719 Rosacea, unspecified: Secondary | ICD-10-CM | POA: Diagnosis not present

## 2017-05-15 MED ORDER — METRONIDAZOLE 0.75 % EX GEL: 1.0000 "application " | Freq: Two times a day (BID) | CUTANEOUS | 2 refills | Status: DC

## 2017-05-15 NOTE — Assessment & Plan Note (Signed)
Mild redness increases of mouth Has been diagnosed with rosacea in the past-the redness does not look exactly like rosacea, but he feels the MetroGel may have worked Will retrying MetroGel If no improvement he will let me know-can consider a low dose steroid cream

## 2017-05-15 NOTE — Telephone Encounter (Signed)
Left message for pt that orders have been placed with [facility] as the resulting agency.

## 2017-05-15 NOTE — Progress Notes (Signed)
Subjective:    Patient ID: Jeremy Ellis, male    DOB: 12/30/1960, 56 y.o.   MRN: [id number]  HPI He is here for an acute visit.   For two weeks he has had a cough.  Sputum has mostly been clear, but occasionally discolored.  He has had some intermittent headaches over the past two weeks that go away with tylenol. He denies fever, chills, congestion, ear pain,  PND, sore throat and sinus pressure.  Drinking cold liquids seems to help.  He has not taken anything for his cough.   Redness on face: A few years ago he had red marks on his face. They tend to occur in the creases near his mouth. Dr. Linna Darner prescribed medication and he also saw dermatology. He does not recall what she used that worked and does not recall the diagnosis. This has come and gone. He currently is mild, but he would like to do something before it gets worse. He thinks it is worse with stress.   Medications and allergies reviewed with patient and updated if appropriate.  Patient Active Problem List   Diagnosis Date Noted  . Influenza-like illness 12/05/2016  . Hyperglycemia 11/06/2016  . Raynaud's phenomenon 06/01/2015  . Personal history of colonic polyps 03/14/2014  . ILEITIS 01/24/2011  . GERD 09/15/2009  . Hyperlipidemia 03/21/2009  . BPH (benign prostatic hyperplasia) 03/18/2008  . HERNIATED DISC 12/11/2006    Current Outpatient Prescriptions on File Prior to Visit  Medication Sig Dispense Refill  . Cholecalciferol (VITAMIN D3) 400 UNITS CAPS Take by mouth daily.    Marland Kitchen ECHINACEA PO Take by mouth 2 (two) times daily as needed (immune support). Reported on 11/02/2015    . Multiple Vitamin (MULTIVITAMIN) tablet Take 1 tablet by mouth daily.      . Omega-3 Fatty Acids (FISH OIL) 1000 MG CAPS Take by mouth. 300 mg of Omega 3 included, 1 by mouth twice daily    . Saw Palmetto 450 MG CAPS Take by mouth. 1 by mouth two times daily    . vitamin C (ASCORBIC ACID) 500 MG tablet Take 500 mg by mouth daily.      No current facility-administered medications on file prior to visit.     Past Medical History:  Diagnosis Date  . GERD (gastroesophageal reflux disease)   . Hyperlipidemia     Past Surgical History:  Procedure Laterality Date  . COLONOSCOPY  2002    Ileitis on Biospy, Dr.Perry  . colonoscopy with polypectomy  12/20/11   sessile serrated polyp    Social History   Social History  . Marital status: Single    Spouse name: N/A  . Number of children: N/A  . Years of education: N/A   Occupational History  . Scientist    Social History Main Topics  . Smoking status: Never Smoker  . Smokeless tobacco: Never Used  . Alcohol use 0.6 oz/week    1 Glasses of wine per week     Comment: Red wine 3-4x monthly    . Drug use: No  . Sexual activity: Not Asked   Other Topics Concern  . None   Social History Narrative   Exercise - 5 days a week - cardio for 30-40 minutes    Family History  Problem Relation Age of Onset  . Colon cancer Paternal Grandmother 12  . Stroke Father 63       hemorrhagic  . Heart failure Father   . Heart attack Father 70  .  Diabetes Father   . Hyperlipidemia Mother   . Diabetes Mother   . Other Mother        giant cell arteritis  . Heart attack Paternal Grandfather 59  . Heart attack Maternal Grandfather        in 2s  . Diabetes Maternal Grandmother   . Alzheimer's disease Maternal Grandmother   . Heart failure Daughter   . Stomach cancer Neg Hx   . Asthma Neg Hx   . COPD Neg Hx     Review of Systems  Constitutional: Negative for appetite change, chills, fatigue and fever.  HENT: Negative for congestion, ear pain, postnasal drip, sinus pain, sinus pressure and sore throat.   Respiratory: Positive for cough. Negative for chest tightness, shortness of breath and wheezing.   Cardiovascular: Negative for chest pain.  Neurological: Positive for light-headedness (occ, not new) and headaches (intermittent for the past two weeks). Negative for  dizziness.       Objective:   Vitals:   05/15/17 0954  BP: 118/68  Pulse: 65  Resp: 12  Temp: 98.1 F (36.7 C)   Filed Weights   05/15/17 0954  Weight: 156 lb (70.8 kg)   Body mass index is 21.76 kg/m.  Wt Readings from Last 3 Encounters:  05/15/17 156 lb (70.8 kg)  01/14/17 157 lb (71.2 kg)  12/09/16 159 lb (72.1 kg)     Physical Exam GENERAL APPEARANCE: Appears stated age, well appearing, NAD EYES: conjunctiva clear, no icterus HEENT: bilateral tympanic membranes and ear canals normal, oropharynx with no erythema, no thyromegaly, trachea midline, no cervical or supraclavicular lymphadenopathy LUNGS: Clear to auscultation without wheeze or crackles, unlabored breathing, good air entry bilaterally HEART: Normal S1,S2 without murmurs SKIN: redness in creases of mouth - not on cheeks or forehead EXTREMITIES: Without clubbing, cyanosis, or edema        Assessment & Plan:   See Problem List for Assessment and Plan of chronic medical problems.

## 2017-05-15 NOTE — Assessment & Plan Note (Signed)
His symptoms are consistent with allergic rhinitis, no infections that will not prescribe an antibiotic Start Claritin or Zyrtec Can try Flonase or Nasonex/Nasacort Call if no improvement

## 2017-05-15 NOTE — Patient Instructions (Signed)
  You can try flonase or nasacort for your cough, allergy symptoms.  You should also start taking claritin or zyrtec.   Medications reviewed and updated.  Changes include trying metrogel for your facial redness.  Your prescription(s) have been submitted to your pharmacy. Please take as directed and contact our office if you believe you are having problem(s) with the medication(s).   Please followup if your symptoms do not improve.     Allergic Rhinitis Allergic rhinitis is when the mucous membranes in the nose respond to allergens. Allergens are particles in the air that cause your body to have an allergic reaction. This causes you to release allergic antibodies. Through a chain of events, these eventually cause you to release histamine into the blood stream. Although meant to protect the body, it is this release of histamine that causes your discomfort, such as frequent sneezing, congestion, and an itchy, runny nose. What are the causes? Seasonal allergic rhinitis (hay fever) is caused by pollen allergens that may come from grasses, trees, and weeds. Year-round allergic rhinitis (perennial allergic rhinitis) is caused by allergens such as house dust mites, pet dander, and mold spores. What are the signs or symptoms?  Nasal stuffiness (congestion).  Itchy, runny nose with sneezing and tearing of the eyes. How is this diagnosed? Your health care provider can help you determine the allergen or allergens that trigger your symptoms. If you and your health care provider are unable to determine the allergen, skin or blood testing may be used. Your health care provider will diagnose your condition after taking your health history and performing a physical exam. Your health care provider may assess you for other related conditions, such as asthma, pink eye, or an ear infection. How is this treated? Allergic rhinitis does not have a cure, but it can be controlled by:  Medicines that block allergy  symptoms. These may include allergy shots, nasal sprays, and oral antihistamines.  Avoiding the allergen.  Hay fever may often be treated with antihistamines in pill or nasal spray forms. Antihistamines block the effects of histamine. There are over-the-counter medicines that may help with nasal congestion and swelling around the eyes. Check with your health care provider before taking or giving this medicine. If avoiding the allergen or the medicine prescribed do not work, there are many new medicines your health care provider can prescribe. Stronger medicine may be used if initial measures are ineffective. Desensitizing injections can be used if medicine and avoidance does not work. Desensitization is when a patient is given ongoing shots until the body becomes less sensitive to the allergen. Make sure you follow up with your health care provider if problems continue. Follow these instructions at home: It is not possible to completely avoid allergens, but you can reduce your symptoms by taking steps to limit your exposure to them. It helps to know exactly what you are allergic to so that you can avoid your specific triggers. Contact a health care provider if:  You have a fever.  You develop a cough that does not stop easily (persistent).  You have shortness of breath.  You start wheezing.  Symptoms interfere with normal daily activities. This information is not intended to replace advice given to you by your health care provider. Make sure you discuss any questions you have with your health care provider. Document Released: 07/23/2001 Document Revised: 06/28/2016 Document Reviewed: 07/05/2013 Elsevier Interactive Patient Education  2017 [employer].

## 2017-05-16 LAB — SPECIMEN STATUS

## 2017-05-16 LAB — TISSUE TRANSGLUTAMINASE ABS,IGG,IGA: Tissue Transglut Ab: <2 U/mL (ref 0–5)

## 2017-05-16 LAB — IGA: IGA/IMMUNOGLOBULIN A, SERUM: 204 mg/dL (ref 90–386)

## 2017-07-03 ENCOUNTER — Ambulatory Visit (INDEPENDENT_AMBULATORY_CARE_PROVIDER_SITE_OTHER): Payer: 59 | Admitting: Podiatry

## 2017-07-03 ENCOUNTER — Encounter: Payer: Self-pay | Admitting: Podiatry

## 2017-07-03 VITALS — BP 110/75 | HR 78 | Resp 16

## 2017-07-03 DIAGNOSIS — B351 Tinea unguium: Secondary | ICD-10-CM | POA: Diagnosis not present

## 2017-07-03 DIAGNOSIS — L600 Ingrowing nail: Secondary | ICD-10-CM | POA: Diagnosis not present

## 2017-07-03 MED ORDER — TERBINAFINE HCL 250 MG PO TABS: ORAL_TABLET | ORAL | 0 refills | Status: DC

## 2017-07-03 NOTE — Patient Instructions (Signed)

## 2017-07-03 NOTE — Progress Notes (Signed)
   Subjective:    Patient ID: Jeremy Ellis, male    DOB: 04-Jun-1961, 56 y.o.   MRN: [id number]  HPI Chief Complaint  Patient presents with  . Nail Problem    Right foot; great toe; nail discoloration; pt stated, "If put toe straight up in shoe, nail feels painful; thinks nail might be loose; needs to be checked"; x2 yrs      Review of Systems  Respiratory: Positive for cough.   All other systems reviewed and are negative.      Objective:   Physical Exam        Assessment & Plan:

## 2017-07-03 NOTE — Progress Notes (Signed)
Subjective:    Patient ID: Jeremy Ellis, male   DOB: 56 y.o.   MRN: [id number]   HPI patient states she's had a two-year history of discoloration of his right hallux nail and recently it has become tender with shoe gear    Review of Systems  All other systems reviewed and are negative.       Objective:  Physical Exam  Constitutional: He appears well-developed and well-nourished.  Cardiovascular: Intact distal pulses.   Musculoskeletal: Normal range of motion.  Neurological: He is alert.  Skin: Skin is warm.  Nursing note and vitals reviewed.  neurovascular status is found to be intact muscle strength adequate range of motion within normal limits with patient noted to have a discolored right hallux nail distal two thirds that also is loose and is moderately painful when pressed. There has been probable history of trauma but he is not remembering specific incident and it has only been painful temporarily     Assessment:   Damaged right hallux nail with probable mycotic component and probable trauma as initial etiology     Plan:    H&P condition reviewed and at this point I have recommended the consideration at one point for nail removal but I like to try to hold off and the treatment of fungus. We spent a great of time going over these different options and at this point I went ahead and I am going to start him on a Lamisil pulse pack along with topical medication and laser treatment 4 for the right hallux nail with padding for the nail and soaks and if symptoms were to get worse we will need to consider nail removal

## 2017-07-07 ENCOUNTER — Encounter: Payer: Self-pay | Admitting: Internal Medicine

## 2017-07-11 NOTE — Progress Notes (Signed)
Subjective:    Patient ID: Jeremy Ellis, male    DOB: 03/29/1961, 56 y.o.   MRN: [id number]  HPI He is here for an acute visit.   Cough:  He was coughing for two weeks, but it did go away a couple of days ago, but he was not able to cancel the appointment.  He was occasionally bringing up clear phlegm.  He did not have any SOB or wheeze.  He denies post nasal drip or GERD.  He denied fever, nasal congestion, sore throat or ear pain.  He feels well today and wonders why the cough lasted that long.  He has allergies, but this was different than his typical allergies.   Medications and allergies reviewed with patient and updated if appropriate.  Patient Active Problem List   Diagnosis Date Noted  . Rosacea 05/15/2017  . Allergic rhinitis 05/15/2017  . Hyperglycemia 11/06/2016  . Raynaud's phenomenon 06/01/2015  . Personal history of colonic polyps 03/14/2014  . ILEITIS 01/24/2011  . GERD 09/15/2009  . Hyperlipidemia 03/21/2009  . BPH (benign prostatic hyperplasia) 03/18/2008  . HERNIATED DISC 12/11/2006    Current Outpatient Prescriptions on File Prior to Visit  Medication Sig Dispense Refill  . Cholecalciferol (VITAMIN D3) 400 UNITS CAPS Take by mouth daily.    Marland Kitchen ECHINACEA PO Take by mouth 2 (two) times daily as needed (immune support). Reported on 11/02/2015    . Multiple Vitamin (MULTIVITAMIN) tablet Take 1 tablet by mouth daily.      . Omega-3 Fatty Acids (FISH OIL) 1000 MG CAPS Take by mouth. 300 mg of Omega 3 included, 1 by mouth twice daily    . Saw Palmetto 450 MG CAPS Take by mouth. 1 by mouth two times daily    . terbinafine (LAMISIL) 250 MG tablet Please take one a day x 7days, repeat every 4 weeks x 4 months 28 tablet 0  . vitamin C (ASCORBIC ACID) 500 MG tablet Take 500 mg by mouth daily.     No current facility-administered medications on file prior to visit.     Past Medical History:  Diagnosis Date  . GERD (gastroesophageal reflux disease)   .  Hyperlipidemia     Past Surgical History:  Procedure Laterality Date  . COLONOSCOPY  2002    Ileitis on Biospy, Dr.Perry  . colonoscopy with polypectomy  12/20/11   sessile serrated polyp    Social History   Social History  . Marital status: Single    Spouse name: N/A  . Number of children: N/A  . Years of education: N/A   Occupational History  . Scientist    Social History Main Topics  . Smoking status: Never Smoker  . Smokeless tobacco: Never Used  . Alcohol use 0.6 oz/week    1 Glasses of wine per week     Comment: Red wine 3-4x monthly    . Drug use: No  . Sexual activity: Not Asked   Other Topics Concern  . None   Social History Narrative   Exercise - 5 days a week - cardio for 30-40 minutes    Family History  Problem Relation Age of Onset  . Colon cancer Paternal Grandmother 19  . Stroke Father 35       hemorrhagic  . Heart failure Father   . Heart attack Father 72  . Diabetes Father   . Hyperlipidemia Mother   . Diabetes Mother   . Other Mother  giant cell arteritis  . Heart attack Paternal Grandfather 50  . Heart attack Maternal Grandfather        in 16s  . Diabetes Maternal Grandmother   . Alzheimer's disease Maternal Grandmother   . Heart failure Daughter   . Stomach cancer Neg Hx   . Asthma Neg Hx   . COPD Neg Hx     Review of Systems  Constitutional: Negative for chills and fever.  HENT: Negative for congestion, ear pain, postnasal drip, sinus pressure and sore throat.   Respiratory: Positive for cough (occ bringing up clear phlegm). Negative for shortness of breath and wheezing.   Gastrointestinal: Positive for nausea (with headaches).       No gerd  Neurological: Positive for light-headedness (positional) and headaches (intermittent, behind one eye). Negative for dizziness.       Objective:   Vitals:   07/15/17 0857  BP: 102/66  Pulse: 84  Resp: 16  Temp: 97.9 F (36.6 C)  SpO2: 98%   Filed Weights   07/15/17 0857    Weight: 156 lb (70.8 kg)   Body mass index is 21.76 kg/m.  Wt Readings from Last 3 Encounters:  07/15/17 156 lb (70.8 kg)  05/15/17 156 lb (70.8 kg)  01/14/17 157 lb (71.2 kg)     Physical Exam  GENERAL APPEARANCE: Appears stated age, well appearing, NAD EYES: conjunctiva clear, no icterus HEENT: bilateral tympanic membranes and ear canals normal, oropharynx with no erythema, no thyromegaly, trachea midline, no cervical or supraclavicular lymphadenopathy LUNGS: Clear to auscultation without wheeze or crackles, unlabored breathing, good air entry bilaterally HEART: Normal S1,S2 without murmurs EXTREMITIES: Without clubbing, cyanosis, or edema        Assessment & Plan:   See Problem List for Assessment and Plan of chronic medical problems.

## 2017-07-15 ENCOUNTER — Ambulatory Visit (INDEPENDENT_AMBULATORY_CARE_PROVIDER_SITE_OTHER): Payer: 59 | Admitting: Internal Medicine

## 2017-07-15 ENCOUNTER — Encounter: Payer: Self-pay | Admitting: Internal Medicine

## 2017-07-15 VITALS — BP 102/66 | HR 84 | Temp 97.9°F | Resp 16 | Wt 156.0 lb

## 2017-07-15 DIAGNOSIS — R059 Cough, unspecified: Secondary | ICD-10-CM | POA: Insufficient documentation

## 2017-07-15 DIAGNOSIS — R05 Cough: Secondary | ICD-10-CM

## 2017-07-15 NOTE — Patient Instructions (Signed)
   Medications reviewed and updated.  No changes recommended at this time.     

## 2017-07-15 NOTE — Assessment & Plan Note (Signed)
Resolved a couple of days ago, but lasted two weeks Likely related to allergies or mild viral URI Exam normal today Discussed symptomatic treatment No further evaluation needed at this time

## 2017-07-16 ENCOUNTER — Encounter: Payer: Self-pay | Admitting: Internal Medicine

## 2017-07-31 ENCOUNTER — Ambulatory Visit: Payer: 59

## 2017-08-15 ENCOUNTER — Encounter: Payer: Self-pay | Admitting: Internal Medicine

## 2017-08-20 DIAGNOSIS — Z23 Encounter for immunization: Secondary | ICD-10-CM | POA: Diagnosis not present

## 2017-08-27 ENCOUNTER — Encounter: Payer: Self-pay | Admitting: Internal Medicine

## 2017-08-31 NOTE — Progress Notes (Signed)
Subjective:    Patient ID: Jeremy Ellis, male    DOB: 1961/05/05, 56 y.o.   MRN: [id number]  HPI He is here for an acute visit.   Cough:  The cough is intermittent.  Last week he had a knot in is throat and he was coughing.  He has coughed up discolored phlegm, especially first thing in the morning.  He does not cough much at night. The knot has improved, but he still has the cough. Seems that the cough is coming go for a long time.  He denies SOB, wheeze, and fevers.  He has had some chills.  He states mild sinus pressure.   He has taken tylenol and claritin.    Medications and allergies reviewed with patient and updated if appropriate.  Patient Active Problem List   Diagnosis Date Noted  . Cough 07/15/2017  . Rosacea 05/15/2017  . Allergic rhinitis 05/15/2017  . Hyperglycemia 11/06/2016  . Raynaud's phenomenon 06/01/2015  . Personal history of colonic polyps 03/14/2014  . ILEITIS 01/24/2011  . GERD 09/15/2009  . Hyperlipidemia 03/21/2009  . BPH (benign prostatic hyperplasia) 03/18/2008  . HERNIATED DISC 12/11/2006    Current Outpatient Prescriptions on File Prior to Visit  Medication Sig Dispense Refill  . Cholecalciferol (VITAMIN D3) 400 UNITS CAPS Take by mouth daily.    Marland Kitchen ECHINACEA PO Take by mouth 2 (two) times daily as needed (immune support). Reported on 11/02/2015    . Multiple Vitamin (MULTIVITAMIN) tablet Take 1 tablet by mouth daily.      . Omega-3 Fatty Acids (FISH OIL) 1000 MG CAPS Take by mouth. 300 mg of Omega 3 included, 1 by mouth twice daily    . Saw Palmetto 450 MG CAPS Take by mouth. 1 by mouth two times daily    . terbinafine (LAMISIL) 250 MG tablet Please take one a day x 7days, repeat every 4 weeks x 4 months 28 tablet 0  . vitamin C (ASCORBIC ACID) 500 MG tablet Take 500 mg by mouth daily.     No current facility-administered medications on file prior to visit.     Past Medical History:  Diagnosis Date  . GERD (gastroesophageal reflux  disease)   . Hyperlipidemia     Past Surgical History:  Procedure Laterality Date  . COLONOSCOPY  2002    Ileitis on Biospy, Dr.Perry  . colonoscopy with polypectomy  12/20/11   sessile serrated polyp    Social History   Social History  . Marital status: Single    Spouse name: N/A  . Number of children: N/A  . Years of education: N/A   Occupational History  . Scientist    Social History Main Topics  . Smoking status: Never Smoker  . Smokeless tobacco: Never Used  . Alcohol use 0.6 oz/week    1 Glasses of wine per week     Comment: Red wine 3-4x monthly    . Drug use: No  . Sexual activity: Not Asked   Other Topics Concern  . None   Social History Narrative   Exercise - 5 days a week - cardio for 30-40 minutes    Family History  Problem Relation Age of Onset  . Colon cancer Paternal Grandmother 11  . Stroke Father 40       hemorrhagic  . Heart failure Father   . Heart attack Father 44  . Diabetes Father   . Hyperlipidemia Mother   . Diabetes Mother   . Other Mother  giant cell arteritis  . Heart attack Paternal Grandfather 28  . Heart attack Maternal Grandfather        in 59s  . Diabetes Maternal Grandmother   . Alzheimer's disease Maternal Grandmother   . Heart failure Daughter   . Stomach cancer Neg Hx   . Asthma Neg Hx   . COPD Neg Hx     Review of Systems  Constitutional: Positive for chills. Negative for fever.  HENT: Positive for sinus pressure. Negative for congestion, ear pain, sinus pain and sore throat.   Respiratory: Positive for cough. Negative for shortness of breath and wheezing.   Neurological: Positive for headaches.       Objective:   Vitals:   09/01/17 0909  BP: 106/60  Pulse: 65  Resp: 16  Temp: 98 F (36.7 C)  SpO2: 97%   Filed Weights   09/01/17 0909  Weight: 158 lb (71.7 kg)   Body mass index is 22.04 kg/m.  Wt Readings from Last 3 Encounters:  09/01/17 158 lb (71.7 kg)  07/15/17 156 lb (70.8 kg)    05/15/17 156 lb (70.8 kg)     Physical Exam GENERAL APPEARANCE: Appears stated age, well appearing, NAD EYES: conjunctiva clear, no icterus HEENT: bilateral tympanic membranes and ear canals normal, oropharynx with mild erythema, no thyromegaly, trachea midline, no cervical or supraclavicular lymphadenopathy LUNGS: Clear to auscultation without wheeze or crackles, unlabored breathing, good air entry bilaterally HEART: Normal S1,S2 without murmurs EXTREMITIES: Without clubbing, cyanosis, or edema        Assessment & Plan:   See Problem List for Assessment and Plan of chronic medical problems.

## 2017-09-01 ENCOUNTER — Ambulatory Visit (INDEPENDENT_AMBULATORY_CARE_PROVIDER_SITE_OTHER): Payer: 59 | Admitting: Internal Medicine

## 2017-09-01 ENCOUNTER — Encounter: Payer: Self-pay | Admitting: Internal Medicine

## 2017-09-01 ENCOUNTER — Ambulatory Visit (INDEPENDENT_AMBULATORY_CARE_PROVIDER_SITE_OTHER)
Admission: RE | Admit: 2017-09-01 | Discharge: 2017-09-01 | Disposition: A | Discharge status: Home / Self Care | Payer: 59 | Source: Ambulatory Visit | Attending: Internal Medicine | Admitting: Internal Medicine

## 2017-09-01 VITALS — BP 106/60 | HR 65 | Temp 98.0°F | Resp 16 | Wt 158.0 lb

## 2017-09-01 DIAGNOSIS — R059 Cough, unspecified: Secondary | ICD-10-CM

## 2017-09-01 DIAGNOSIS — J3489 Other specified disorders of nose and nasal sinuses: Secondary | ICD-10-CM | POA: Diagnosis not present

## 2017-09-01 DIAGNOSIS — R05 Cough: Secondary | ICD-10-CM | POA: Diagnosis not present

## 2017-09-01 MED ORDER — DOXYCYCLINE HYCLATE 100 MG PO TABS: 100.0000 mg | ORAL_TABLET | Freq: Two times a day (BID) | ORAL | 0 refills | Status: DC

## 2017-09-01 NOTE — Assessment & Plan Note (Signed)
Recurrent Discolored phlegm Will check cxr Start doxycycline for possible sinus infection Symptomatic treatment Call if no improvement

## 2017-09-01 NOTE — Assessment & Plan Note (Signed)
Possible sinus infection Will start doxycycline Continue otc cold meds for symptom relief Call if no improvement

## 2017-09-01 NOTE — Patient Instructions (Signed)
Take the antibiotic as prescribed.  Take other cold medications if needed for symptom relief.   Have a chest x-ray today.  We will call with the result.     Call if no improvement

## 2017-09-30 ENCOUNTER — Encounter: Payer: Self-pay | Admitting: Internal Medicine

## 2017-11-25 ENCOUNTER — Encounter: Payer: Self-pay | Admitting: Internal Medicine

## 2017-11-26 ENCOUNTER — Telehealth: Payer: Self-pay | Admitting: Internal Medicine

## 2017-11-26 NOTE — Telephone Encounter (Signed)
Tried contacting pt, unable to LVM 

## 2017-11-26 NOTE — Telephone Encounter (Signed)
Please write written RX for labs. Place pt on Shingrix waitlist.

## 2017-11-26 NOTE — Telephone Encounter (Signed)
Pt is scheduled to come in for his physical on 02/06/2018. He would like a list of any test that would be required and would prefer to have them done at [facility]..

## 2017-11-26 NOTE — Telephone Encounter (Signed)
Tried contacting pt, unable to LVM due to full mailbox. Message sent via [place].

## 2017-11-26 NOTE — Telephone Encounter (Signed)
Labs - cbc, cmp, tsh, lipid panel, psa   Please advise on shingles vaccine -- possible side effects arm pain, redness on arm and flu like symptoms are typical -- not everyone gets the side effects so he may or may not get them.  Can get here or at a local pharmacy or [facility].

## 2017-11-27 NOTE — Telephone Encounter (Signed)
rx written

## 2017-12-03 DIAGNOSIS — L814 Other melanin hyperpigmentation: Secondary | ICD-10-CM | POA: Diagnosis not present

## 2017-12-03 DIAGNOSIS — L218 Other seborrheic dermatitis: Secondary | ICD-10-CM | POA: Diagnosis not present

## 2017-12-25 ENCOUNTER — Encounter: Payer: Self-pay | Admitting: Internal Medicine

## 2018-01-28 DIAGNOSIS — N400 Enlarged prostate without lower urinary tract symptoms: Secondary | ICD-10-CM | POA: Diagnosis not present

## 2018-02-02 ENCOUNTER — Encounter: Payer: Self-pay | Admitting: Internal Medicine

## 2018-02-03 NOTE — Telephone Encounter (Signed)
We can order the labs and have pt pick up the requisitions and take them to [facility]. If he is to get them drawn in our lab he may get charged for drawing the labs.

## 2018-02-06 ENCOUNTER — Encounter: Payer: 59 | Admitting: Internal Medicine

## 2018-02-16 ENCOUNTER — Encounter: Payer: 59 | Admitting: Internal Medicine

## 2018-03-03 ENCOUNTER — Other Ambulatory Visit: Payer: Self-pay | Admitting: Internal Medicine

## 2018-03-03 DIAGNOSIS — R739 Hyperglycemia, unspecified: Secondary | ICD-10-CM | POA: Diagnosis not present

## 2018-03-03 DIAGNOSIS — N400 Enlarged prostate without lower urinary tract symptoms: Secondary | ICD-10-CM | POA: Diagnosis not present

## 2018-03-03 DIAGNOSIS — E785 Hyperlipidemia, unspecified: Secondary | ICD-10-CM | POA: Diagnosis not present

## 2018-03-03 DIAGNOSIS — Z0000 Encounter for general adult medical examination without abnormal findings: Secondary | ICD-10-CM | POA: Diagnosis not present

## 2018-03-03 LAB — PSA: PSA: 0.29

## 2018-03-03 LAB — BASIC METABOLIC PANEL
BUN: 19 (ref 4–21)
CREATININE: 1.2 (ref 0.6–1.3)
Glucose: 105

## 2018-03-03 LAB — CBC AND DIFFERENTIAL
HEMATOCRIT: 43 (ref 41–53)
Hemoglobin: 15 (ref 13.5–17.5)
PLATELETS: 153 (ref 150–399)
WBC: 3.8
WBC: 4.3

## 2018-03-03 LAB — LIPID PANEL
CHOLESTEROL: 151 (ref 0–200)
HDL: 47 (ref 35–70)
LDL CALC: 95

## 2018-03-03 LAB — TSH: TSH: 4.15 (ref 0.41–5.90)

## 2018-03-03 LAB — HEMOGLOBIN A1C: Hemoglobin A1C: 5.5

## 2018-03-04 LAB — CBC WITH DIFFERENTIAL/PLATELET
BASOS ABS: 0 10*3/uL (ref 0.0–0.2)
Basos: 0 %
EOS (ABSOLUTE): 0.4 10*3/uL (ref 0.0–0.4)
Eos: 10 %
Hematocrit: 42.8 % (ref 37.5–51.0)
Hemoglobin: 15 g/dL (ref 13.0–17.7)
IMMATURE GRANULOCYTES: 0 %
Immature Grans (Abs): 0 10*3/uL (ref 0.0–0.1)
LYMPHS ABS: 0.9 10*3/uL (ref 0.7–3.1)
Lymphs: 23 %
MCH: 33.6 pg — ABNORMAL HIGH (ref 26.6–33.0)
MCHC: 35 g/dL (ref 31.5–35.7)
MCV: 96 fL (ref 79–97)
MONOS ABS: 0.5 10*3/uL (ref 0.1–0.9)
Monocytes: 13 %
NEUTROS PCT: 54 %
Neutrophils Absolute: 2.1 10*3/uL (ref 1.4–7.0)
PLATELETS: 153 10*3/uL (ref 150–379)
RBC: 4.46 x10E6/uL (ref 4.14–5.80)
RDW: 12.6 % (ref 12.3–15.4)
WBC: 3.8 10*3/uL (ref 3.4–10.8)

## 2018-03-04 LAB — LIPID PANEL W/O CHOL/HDL RATIO
Cholesterol, Total: 151 mg/dL (ref 100–199)
HDL: 47 mg/dL (ref >39)
LDL CALC: 95 mg/dL (ref 0–99)
TRIGLYCERIDES: 46 mg/dL (ref 0–149)
VLDL Cholesterol Cal: 9 mg/dL (ref 5–40)

## 2018-03-04 LAB — COMPREHENSIVE METABOLIC PANEL
ALT: 17 IU/L (ref 0–44)
AST: 24 IU/L (ref 0–40)
Albumin/Globulin Ratio: 1.8 (ref 1.2–2.2)
Albumin: 4.2 g/dL (ref 3.5–5.5)
Alkaline Phosphatase: 54 IU/L (ref 39–117)
BUN/Creatinine Ratio: 16 (ref 9–20)
BUN: 19 mg/dL (ref 6–24)
Bilirubin Total: 0.6 mg/dL (ref 0.0–1.2)
CALCIUM: 9.3 mg/dL (ref 8.7–10.2)
CO2: 25 mmol/L (ref 20–29)
CREATININE: 1.18 mg/dL (ref 0.76–1.27)
Chloride: 103 mmol/L (ref 96–106)
GFR calc Af Amer: 78 mL/min/{1.73_m2} (ref >59)
GFR, EST NON AFRICAN AMERICAN: 68 mL/min/{1.73_m2} (ref >59)
GLOBULIN, TOTAL: 2.3 g/dL (ref 1.5–4.5)
GLUCOSE: 105 mg/dL — AB (ref 65–99)
Potassium: 4 mmol/L (ref 3.5–5.2)
SODIUM: 143 mmol/L (ref 134–144)
Total Protein: 6.5 g/dL (ref 6.0–8.5)

## 2018-03-04 LAB — PSA, TOTAL AND FREE
PSA, Free Pct: 36.3 %
PSA, Free: 0.29 ng/mL
Prostate Specific Ag, Serum: 0.8 ng/mL (ref 0.0–4.0)

## 2018-03-04 LAB — TSH: TSH: 4.15 u[IU]/mL (ref 0.450–4.500)

## 2018-03-04 LAB — HGB A1C W/O EAG: HEMOGLOBIN A1C: 5.5 % (ref 4.8–5.6)

## 2018-03-05 ENCOUNTER — Encounter: Payer: Self-pay | Admitting: Internal Medicine

## 2018-03-06 NOTE — Progress Notes (Signed)
Subjective:    Patient ID: Jeremy Ellis, male    DOB: 12-04-1960, 57 y.o.   MRN: [id number]  HPI He is here for a physical exam.   There have been no changes in his health.    Medications and allergies reviewed with patient and updated if appropriate.  Patient Active Problem List   Diagnosis Date Noted  . Sinus pressure 09/01/2017  . Cough 07/15/2017  . Rosacea 05/15/2017  . Allergic rhinitis 05/15/2017  . Hyperglycemia 11/06/2016  . Raynaud's phenomenon 06/01/2015  . Personal history of colonic polyps 03/14/2014  . ILEITIS 01/24/2011  . GERD 09/15/2009  . Hyperlipidemia 03/21/2009  . BPH (benign prostatic hyperplasia) 03/18/2008  . HERNIATED DISC 12/11/2006    Current Outpatient Medications on File Prior to Visit  Medication Sig Dispense Refill  . Cholecalciferol (VITAMIN D3) 400 UNITS CAPS Take by mouth daily.    Marland Kitchen ECHINACEA PO Take by mouth 2 (two) times daily as needed (immune support). Reported on 11/02/2015    . Multiple Vitamin (MULTIVITAMIN) tablet Take 1 tablet by mouth daily.      . Omega-3 Fatty Acids (FISH OIL) 1000 MG CAPS Take by mouth. 300 mg of Omega 3 included, 1 by mouth twice daily    . Saw Palmetto 450 MG CAPS Take by mouth. 1 by mouth two times daily    . terbinafine (LAMISIL) 250 MG tablet Please take one a day x 7days, repeat every 4 weeks x 4 months 28 tablet 0  . vitamin C (ASCORBIC ACID) 500 MG tablet Take 500 mg by mouth daily.     No current facility-administered medications on file prior to visit.     Past Medical History:  Diagnosis Date  . GERD (gastroesophageal reflux disease)   . Hyperlipidemia     Past Surgical History:  Procedure Laterality Date  . COLONOSCOPY  2002    Ileitis on Biospy, Dr.Perry  . colonoscopy with polypectomy  12/20/11   sessile serrated polyp    Social History   Socioeconomic History  . Marital status: Single    Spouse name: Not on file  . Number of children: Not on file  . Years of education:  Not on file  . Highest education level: Not on file  Occupational History  . Occupation: Biomedical engineer  . Financial resource strain: Not on file  . Food insecurity:    Worry: Not on file    Inability: Not on file  . Transportation needs:    Medical: Not on file    Non-medical: Not on file  Tobacco Use  . Smoking status: Never Smoker  . Smokeless tobacco: Never Used  Substance and Sexual Activity  . Alcohol use: Yes    Alcohol/week: 0.6 oz    Types: 1 Glasses of wine per week    Comment: Red wine 3-4x monthly    . Drug use: No  . Sexual activity: Not on file  Lifestyle  . Physical activity:    Days per week: Not on file    Minutes per session: Not on file  . Stress: Not on file  Relationships  . Social connections:    Talks on phone: Not on file    Gets together: Not on file    Attends religious service: Not on file    Active member of club or organization: Not on file    Attends meetings of clubs or organizations: Not on file    Relationship status: Not on file  Other Topics Concern  . Not on file  Social History Narrative   Exercise - 5 days a week - cardio for 30-40 minutes    Family History  Problem Relation Age of Onset  . Colon cancer Paternal Grandmother 55  . Stroke Father 66       hemorrhagic  . Heart failure Father   . Heart attack Father 62  . Diabetes Father   . Hyperlipidemia Mother   . Diabetes Mother   . Other Mother        giant cell arteritis  . Heart attack Paternal Grandfather 35  . Heart attack Maternal Grandfather        in 14s  . Diabetes Maternal Grandmother   . Alzheimer's disease Maternal Grandmother   . Heart failure Daughter   . Stomach cancer Neg Hx   . Asthma Neg Hx   . COPD Neg Hx     Review of Systems  Constitutional: Negative for chills and fever.  HENT:       Throat clearing  Eyes: Negative for visual disturbance.  Respiratory: Positive for cough (dry - clear mucus on occasion). Negative for shortness of  breath and wheezing.   Cardiovascular: Negative for chest pain, palpitations and leg swelling.  Gastrointestinal: Positive for nausea (early am - not daily). Negative for abdominal pain, blood in stool, constipation and diarrhea.  Genitourinary: Negative for dysuria and hematuria.  Musculoskeletal: Positive for neck pain and neck stiffness. Negative for arthralgias and back pain.  Skin: Negative for color change and rash.  Neurological: Positive for headaches (related to computer work). Negative for dizziness and light-headedness.  Psychiatric/Behavioral: Negative for dysphoric mood. The patient is nervous/anxious (stress from work).        Objective:   Vitals:   03/09/18 0753  BP: 124/82  Pulse: 66  Resp: 16  Temp: 98.3 F (36.8 C)  SpO2: 98%   Filed Weights   03/09/18 0753  Weight: 160 lb (72.6 kg)   Body mass index is 22.32 kg/m.  Wt Readings from Last 3 Encounters:  03/09/18 160 lb (72.6 kg)  09/01/17 158 lb (71.7 kg)  07/15/17 156 lb (70.8 kg)     Physical Exam Constitutional: He appears well-developed and well-nourished. No distress.  HENT:  Head: Normocephalic and atraumatic.  Right Ear: External ear normal.  Left Ear: External ear normal.  Mouth/Throat: Oropharynx is clear and moist.  Normal ear canals and TM b/l  Eyes: Conjunctivae and EOM are normal.  Neck: Neck supple. No tracheal deviation present. No thyromegaly present.  No carotid bruit  Cardiovascular: Normal rate, regular rhythm, normal heart sounds and intact distal pulses.   No murmur heard. Pulmonary/Chest: Effort normal and breath sounds normal. No respiratory distress. He has no wheezes. He has no rales.  Abdominal: Soft. He exhibits no distension. There is no tenderness.  Genitourinary: deferred  Musculoskeletal: He exhibits no edema.  Lymphadenopathy:   He has no cervical adenopathy.  Skin: Skin is warm and dry. He is not diaphoretic.  Psychiatric: He has a normal mood and affect. His  behavior is normal.         Assessment & Plan:   Physical exam: Screening blood work reviewed Immunizations  shingrix     Td and flu up to date Colonoscopy   Due - he will schedule Eye exams    Up to date  EKG  Done 2015 Exercise   regular Weight     Normal BMI Skin    No  concerns  - sees derm Substance abuse   none  See Problem List for Assessment and Plan of chronic medical problems.

## 2018-03-06 NOTE — Patient Instructions (Addendum)
Your blood work was reviewed.  Schedule your colonoscopy.    All other Health Maintenance issues reviewed.   All recommended immunizations and age-appropriate screenings are up-to-date or discussed.  No immunization administered today.   Check on the shingles vaccine.    Medications reviewed and updated.  No changes recommended at this time.    Please followup in one year    Health Maintenance, Male A healthy lifestyle and preventive care is important for your health and wellness. Ask your health care provider about what schedule of regular examinations is right for you. What should I know about weight and diet? Eat a Healthy Diet  Eat plenty of vegetables, fruits, whole grains, low-fat dairy products, and lean protein.  Do not eat a lot of foods high in solid fats, added sugars, or salt.  Maintain a Healthy Weight Regular exercise can help you achieve or maintain a healthy weight. You should:  Do at least 150 minutes of exercise each week. The exercise should increase your heart rate and make you sweat (moderate-intensity exercise).  Do strength-training exercises at least twice a week.  Watch Your Levels of Cholesterol and Blood Lipids  Have your blood tested for lipids and cholesterol every 5 years starting at 59 years of age. If you are at high risk for heart disease, you should start having your blood tested when you are 59 years old. You may need to have your cholesterol levels checked more often if: ? Your lipid or cholesterol levels are high. ? You are older than 59 years of age. ? You are at high risk for heart disease.  What should I know about cancer screening? Many types of cancers can be detected early and may often be prevented. Lung Cancer  You should be screened every year for lung cancer if: ? You are a current smoker who has smoked for at least 30 years. ? You are a former smoker who has quit within the past 15 years.  Talk to your health care provider  about your screening options, when you should start screening, and how often you should be screened.  Colorectal Cancer  Routine colorectal cancer screening usually begins at 59 years of age and should be repeated every 5-10 years until you are 59 years old. You may need to be screened more often if early forms of precancerous polyps or small growths are found. Your health care provider may recommend screening at an earlier age if you have risk factors for colon cancer.  Your health care provider may recommend using home test kits to check for hidden blood in the stool.  A small camera at the end of a tube can be used to examine your colon (sigmoidoscopy or colonoscopy). This checks for the earliest forms of colorectal cancer.  Prostate and Testicular Cancer  Depending on your age and overall health, your health care provider may do certain tests to screen for prostate and testicular cancer.  Talk to your health care provider about any symptoms or concerns you have about testicular or prostate cancer.  Skin Cancer  Check your skin from head to toe regularly.  Tell your health care provider about any new moles or changes in moles, especially if: ? There is a change in a mole's size, shape, or color. ? You have a mole that is larger than a pencil eraser.  Always use sunscreen. Apply sunscreen liberally and repeat throughout the day.  Protect yourself by wearing long sleeves, pants, a wide-brimmed hat, and  sunglasses when outside.  What should I know about heart disease, diabetes, and high blood pressure?  If you are 83-36 years of age, have your blood pressure checked every 3-5 years. If you are 57 years of age or older, have your blood pressure checked every year. You should have your blood pressure measured twice-once when you are at a hospital or clinic, and once when you are not at a hospital or clinic. Record the average of the two measurements. To check your blood pressure when you  are not at a hospital or clinic, you can use: ? An automated blood pressure machine at a pharmacy. ? A home blood pressure monitor.  Talk to your health care provider about your target blood pressure.  If you are between 56-27 years old, ask your health care provider if you should take aspirin to prevent heart disease.  Have regular diabetes screenings by checking your fasting blood sugar level. ? If you are at a normal weight and have a low risk for diabetes, have this test once every three years after the age of 68. ? If you are overweight and have a high risk for diabetes, consider being tested at a younger age or more often.  A one-time screening for abdominal aortic aneurysm (AAA) by ultrasound is recommended for men aged 74-75 years who are current or former smokers. What should I know about preventing infection? Hepatitis B If you have a higher risk for hepatitis B, you should be screened for this virus. Talk with your health care provider to find out if you are at risk for hepatitis B infection. Hepatitis C Blood testing is recommended for:  Everyone born from [birth year] through 1967.  Anyone with known risk factors for hepatitis C.  Sexually Transmitted Diseases (STDs)  You should be screened each year for STDs including gonorrhea and chlamydia if: ? You are sexually active and are younger than 58 years of age. ? You are older than 59 years of age and your health care provider tells you that you are at risk for this type of infection. ? Your sexual activity has changed since you were last screened and you are at an increased risk for chlamydia or gonorrhea. Ask your health care provider if you are at risk.  Talk with your health care provider about whether you are at high risk of being infected with HIV. Your health care provider may recommend a prescription medicine to help prevent HIV infection.  What else can I do?  Schedule regular health, dental, and eye exams.  Stay  current with your vaccines (immunizations).  Do not use any tobacco products, such as cigarettes, chewing tobacco, and e-cigarettes. If you need help quitting, ask your health care provider.  Limit alcohol intake to no more than 2 drinks per day. One drink equals 12 ounces of beer, 5 ounces of wine, or 1 ounces of hard liquor.  Do not use street drugs.  Do not share needles.  Ask your health care provider for help if you need support or information about quitting drugs.  Tell your health care provider if you often feel depressed.  Tell your health care provider if you have ever been abused or do not feel safe at home. This information is not intended to replace advice given to you by your health care provider. Make sure you discuss any questions you have with your health care provider. Document Released: 04/25/2008 Document Revised: 06/26/2016 Document Reviewed: 08/01/2015 Elsevier Interactive Patient Education  2018  [employer].

## 2018-03-09 ENCOUNTER — Encounter: Payer: Self-pay | Admitting: Internal Medicine

## 2018-03-09 ENCOUNTER — Ambulatory Visit (INDEPENDENT_AMBULATORY_CARE_PROVIDER_SITE_OTHER): Payer: 59 | Admitting: Internal Medicine

## 2018-03-09 VITALS — BP 124/82 | HR 66 | Temp 98.3°F | Resp 16 | Ht 71.0 in | Wt 160.0 lb

## 2018-03-09 DIAGNOSIS — E782 Mixed hyperlipidemia: Secondary | ICD-10-CM

## 2018-03-09 DIAGNOSIS — N400 Enlarged prostate without lower urinary tract symptoms: Secondary | ICD-10-CM | POA: Diagnosis not present

## 2018-03-09 DIAGNOSIS — J309 Allergic rhinitis, unspecified: Secondary | ICD-10-CM | POA: Diagnosis not present

## 2018-03-09 DIAGNOSIS — R739 Hyperglycemia, unspecified: Secondary | ICD-10-CM | POA: Diagnosis not present

## 2018-03-09 DIAGNOSIS — Z0000 Encounter for general adult medical examination without abnormal findings: Secondary | ICD-10-CM | POA: Diagnosis not present

## 2018-03-09 NOTE — Assessment & Plan Note (Signed)
Follows with urology Asymptomatic  psa normal

## 2018-03-09 NOTE — Assessment & Plan Note (Signed)
Lab Results  Component Value Date   HGBA1C 5.5 03/03/2018    Continue healthy diet and regular exercise

## 2018-03-09 NOTE — Assessment & Plan Note (Signed)
Having drainage, cough Advised otc allergy meds

## 2018-03-09 NOTE — Assessment & Plan Note (Signed)
Lifestyle controlled.

## 2018-03-10 ENCOUNTER — Telehealth: Payer: Self-pay

## 2018-03-10 NOTE — Telephone Encounter (Signed)
Patient needs to schedule nurse visit to get first shingrix vaccine----if appt is made, let Sederick Jacobsen,RN at [facility] office know so that both vaccines can be labeled

## 2018-03-12 ENCOUNTER — Encounter: Payer: Self-pay | Admitting: Internal Medicine

## 2018-03-26 ENCOUNTER — Encounter: Payer: Self-pay | Admitting: Internal Medicine

## 2018-03-26 NOTE — Telephone Encounter (Signed)
Appointment scheduled 04/09/18 at 9:00am (waiting on okay from [facility]).

## 2018-03-26 NOTE — Telephone Encounter (Signed)
Both vaccines labeled and placed in refrigerator, mychart message sent to patient

## 2018-03-26 NOTE — Telephone Encounter (Signed)
Jeremy Ellis, Can you help with his clinical questions regarding Shingrix?  Thanks!!

## 2018-04-07 ENCOUNTER — Encounter: Payer: Self-pay | Admitting: Internal Medicine

## 2018-04-09 ENCOUNTER — Encounter: Payer: Self-pay | Admitting: Internal Medicine

## 2018-04-09 ENCOUNTER — Ambulatory Visit (INDEPENDENT_AMBULATORY_CARE_PROVIDER_SITE_OTHER): Payer: 59 | Admitting: *Deleted

## 2018-04-09 DIAGNOSIS — Z23 Encounter for immunization: Secondary | ICD-10-CM

## 2018-04-17 ENCOUNTER — Encounter: Payer: Self-pay | Admitting: Internal Medicine

## 2018-05-06 ENCOUNTER — Encounter: Payer: Self-pay | Admitting: Internal Medicine

## 2018-05-08 ENCOUNTER — Encounter: Payer: Self-pay | Admitting: Internal Medicine

## 2018-05-21 ENCOUNTER — Encounter: Payer: Self-pay | Admitting: Internal Medicine

## 2018-05-22 ENCOUNTER — Encounter: Payer: Self-pay | Admitting: Internal Medicine

## 2018-05-29 ENCOUNTER — Encounter: Payer: Self-pay | Admitting: Internal Medicine

## 2018-06-10 ENCOUNTER — Ambulatory Visit (INDEPENDENT_AMBULATORY_CARE_PROVIDER_SITE_OTHER): Payer: 59

## 2018-06-10 DIAGNOSIS — Z299 Encounter for prophylactic measures, unspecified: Secondary | ICD-10-CM | POA: Diagnosis not present

## 2018-06-11 ENCOUNTER — Encounter: Payer: Self-pay | Admitting: Internal Medicine

## 2018-07-06 ENCOUNTER — Encounter: Payer: Self-pay | Admitting: Internal Medicine

## 2018-08-18 ENCOUNTER — Encounter: Payer: Self-pay | Admitting: Internal Medicine

## 2018-09-02 ENCOUNTER — Encounter: Payer: Self-pay | Admitting: Internal Medicine

## 2018-09-03 ENCOUNTER — Encounter: Payer: Self-pay | Admitting: Internal Medicine

## 2018-11-12 ENCOUNTER — Encounter: Payer: Self-pay | Admitting: Internal Medicine

## 2018-11-12 DIAGNOSIS — R42 Dizziness and giddiness: Secondary | ICD-10-CM

## 2018-12-16 ENCOUNTER — Ambulatory Visit: Payer: 59 | Admitting: Internal Medicine

## 2018-12-21 NOTE — Progress Notes (Signed)
Subjective:    Patient ID: Jeremy Ellis, male    DOB: 1961-01-09, 58 y.o.   MRN: [id number]  HPI The patient is here for an acute visit.   He has had intermittent symptoms for 24 months.  He has feeling of unsteadiness that is intermittent.  He will be walking to a meeting and may experience a sudden sensation of falling forward or back.  He feels more comfortable walking close to the wall.    When he stands after sitting he has a transient feeling of unbalanced feeling.  He denies true spinning but it feels more like he is on a boat temporarily.  He is unsure how often this happens, but it is regularly.  He is unsure if it happens every day.  It does not happen every time he changes position or is walking down the hall.  He states it feels different than lightheadedness that he would get from standing up quickly from low blood pressure.  He drinks plenty of fluids during the day.  He eats regularly.   He does have some intermittent allergy and sinus issues.  He has taken Allegra recently and that has not affected the above symptoms.  Medications and allergies reviewed with patient and updated if appropriate.  Patient Active Problem List   Diagnosis Date Noted  . Sinus pressure 09/01/2017  . Cough 07/15/2017  . Rosacea 05/15/2017  . Allergic rhinitis 05/15/2017  . Hyperglycemia 11/06/2016  . Raynaud's phenomenon 06/01/2015  . Personal history of colonic polyps 03/14/2014  . ILEITIS 01/24/2011  . GERD 09/15/2009  . Hyperlipidemia 03/21/2009  . BPH (benign prostatic hyperplasia) 03/18/2008  . HERNIATED DISC 12/11/2006    Current Outpatient Medications on File Prior to Visit  Medication Sig Dispense Refill  . Cholecalciferol (VITAMIN D3) 400 UNITS CAPS Take by mouth daily.    Marland Kitchen ECHINACEA PO Take by mouth 2 (two) times daily as needed (immune support). Reported on 11/02/2015    . Multiple Vitamin (MULTIVITAMIN) tablet Take 1 tablet by mouth daily.      . Saw Palmetto 450  MG CAPS Take by mouth. 1 by mouth two times daily    . vitamin C (ASCORBIC ACID) 500 MG tablet Take 500 mg by mouth daily.     No current facility-administered medications on file prior to visit.     Past Medical History:  Diagnosis Date  . GERD (gastroesophageal reflux disease)   . Hyperlipidemia     Past Surgical History:  Procedure Laterality Date  . COLONOSCOPY  2002    Ileitis on Biospy, Dr.Perry  . colonoscopy with polypectomy  12/20/11   sessile serrated polyp    Social History   Socioeconomic History  . Marital status: Single    Spouse name: Not on file  . Number of children: Not on file  . Years of education: Not on file  . Highest education level: Not on file  Occupational History  . Occupation: Biomedical engineer  . Financial resource strain: Not on file  . Food insecurity:    Worry: Not on file    Inability: Not on file  . Transportation needs:    Medical: Not on file    Non-medical: Not on file  Tobacco Use  . Smoking status: Never Smoker  . Smokeless tobacco: Never Used  Substance and Sexual Activity  . Alcohol use: Yes    Alcohol/week: 1.0 standard drinks    Types: 1 Glasses of wine per week  Comment: Red wine 3-4x monthly    . Drug use: No  . Sexual activity: Not on file  Lifestyle  . Physical activity:    Days per week: Not on file    Minutes per session: Not on file  . Stress: Not on file  Relationships  . Social connections:    Talks on phone: Not on file    Gets together: Not on file    Attends religious service: Not on file    Active member of club or organization: Not on file    Attends meetings of clubs or organizations: Not on file    Relationship status: Not on file  Other Topics Concern  . Not on file  Social History Narrative   Exercise - 5 days a week - cardio for 30-40 minutes    Family History  Problem Relation Age of Onset  . Colon cancer Paternal Grandmother 95  . Stroke Father 66       hemorrhagic  . Heart  failure Father   . Heart attack Father 2  . Diabetes Father   . Hyperlipidemia Mother   . Diabetes Mother   . Other Mother        giant cell arteritis  . Heart attack Paternal Grandfather 30  . Heart attack Maternal Grandfather        in 39s  . Diabetes Maternal Grandmother   . Alzheimer's disease Maternal Grandmother   . Heart failure Daughter   . Stomach cancer Neg Hx   . Asthma Neg Hx   . COPD Neg Hx     Review of Systems  Constitutional: Negative for fever.  HENT: Positive for congestion (x few days, light yellow- greenish mucus) and sinus pressure (occasional). Negative for ear pain and sore throat.   Respiratory: Positive for cough (today a little cough, dry). Negative for shortness of breath and wheezing.   Neurological: Positive for dizziness (feeling of unbalance) and headaches (occasional).       Objective:   Vitals:   12/22/18 0845  BP: 110/70  Pulse: 86  Resp: 16  Temp: 98.8 F (37.1 C)  SpO2: 96%   BP Readings from Last 3 Encounters:  12/22/18 110/70  03/09/18 124/82  09/01/17 106/60   Wt Readings from Last 3 Encounters:  12/22/18 154 lb 11.2 oz (70.2 kg)  03/09/18 160 lb (72.6 kg)  09/01/17 158 lb (71.7 kg)   Body mass index is 21.58 kg/m.   Physical Exam    GENERAL APPEARANCE: Appears stated age, well appearing, NAD EYES: conjunctiva clear, no icterus HEENT: bilateral tympanic membranes and ear canals normal, oropharynx with no erythema, no thyromegaly, trachea midline, no cervical or supraclavicular lymphadenopathy LUNGS: Clear to auscultation without wheeze or crackles, unlabored breathing, good air entry bilaterally CARDIOVASCULAR: Normal S1,S2 without murmurs, no edema SKIN: Warm, dry      Assessment & Plan:    See Problem List for Assessment and Plan of chronic medical problems.

## 2018-12-22 ENCOUNTER — Ambulatory Visit: Payer: 59 | Admitting: Internal Medicine

## 2018-12-22 ENCOUNTER — Encounter: Payer: Self-pay | Admitting: Internal Medicine

## 2018-12-22 VITALS — BP 110/70 | HR 86 | Temp 98.8°F | Resp 16 | Ht 71.0 in | Wt 154.7 lb

## 2018-12-22 DIAGNOSIS — R42 Dizziness and giddiness: Secondary | ICD-10-CM | POA: Insufficient documentation

## 2018-12-22 NOTE — Assessment & Plan Note (Signed)
He is symptoms similar to vertigo, but this is not typical spinning sensation or BPPV Symptoms do not sound suggestive of orthostatic hypotension or hypoglycemia He does have intermittent sinus issues and allergies He is interested in having this evaluated further because it has been going on for a long time and continues to occur.  He feels it is increasing Will refer to ENT for further evaluation

## 2018-12-22 NOTE — Patient Instructions (Signed)
A referral was ordered for ENT - they will call you to schedule an appointment.

## 2019-01-04 ENCOUNTER — Encounter: Payer: Self-pay | Admitting: Internal Medicine

## 2019-02-04 ENCOUNTER — Encounter: Payer: Self-pay | Admitting: Internal Medicine

## 2019-02-15 ENCOUNTER — Encounter: Payer: Self-pay | Admitting: Internal Medicine

## 2019-03-15 ENCOUNTER — Encounter: Payer: Self-pay | Admitting: Internal Medicine

## 2019-03-22 ENCOUNTER — Encounter: Payer: Self-pay | Admitting: Internal Medicine

## 2019-04-15 ENCOUNTER — Encounter: Payer: Self-pay | Admitting: Internal Medicine

## 2019-04-23 ENCOUNTER — Encounter: Payer: Self-pay | Admitting: Internal Medicine

## 2019-04-28 ENCOUNTER — Encounter: Payer: 59 | Admitting: Internal Medicine

## 2019-04-28 ENCOUNTER — Encounter: Payer: Self-pay | Admitting: Internal Medicine

## 2019-04-29 ENCOUNTER — Encounter: Payer: Self-pay | Admitting: Internal Medicine

## 2019-05-19 ENCOUNTER — Encounter: Payer: Self-pay | Admitting: Internal Medicine

## 2019-06-08 ENCOUNTER — Encounter: Payer: 59 | Admitting: Internal Medicine

## 2019-06-08 ENCOUNTER — Telehealth: Payer: Self-pay | Admitting: Emergency Medicine

## 2019-06-14 NOTE — Telephone Encounter (Signed)
error 

## 2019-06-16 ENCOUNTER — Telehealth: Payer: Self-pay

## 2019-06-16 DIAGNOSIS — E782 Mixed hyperlipidemia: Secondary | ICD-10-CM

## 2019-06-16 DIAGNOSIS — Z0000 Encounter for general adult medical examination without abnormal findings: Secondary | ICD-10-CM

## 2019-06-16 DIAGNOSIS — R739 Hyperglycemia, unspecified: Secondary | ICD-10-CM

## 2019-06-16 NOTE — Telephone Encounter (Signed)
Pending - does it have to say external or other lab or [facility]?

## 2019-06-16 NOTE — Progress Notes (Signed)
Subjective:    Patient ID: Jeremy Ellis, male    DOB: [birth year], 58 y.o.   MRN: [id number]  HPI He is here for a physical exam.   He has no major concerns. His stress level is higher.  He is exercising some, but not as much as usual.  He misses the gym.    He has lost weight - somewhat unintentionally - the increased anxiety has decreased his appetite causing the weight loss.    Medications and allergies reviewed with patient and updated if appropriate.  Patient Active Problem List   Diagnosis Date Noted  . Vertigo 12/22/2018  . Sinus pressure 09/01/2017  . Rosacea 05/15/2017  . Allergic rhinitis 05/15/2017  . Hyperglycemia 11/06/2016  . Raynaud's phenomenon 06/01/2015  . Personal history of colonic polyps 03/14/2014  . ILEITIS 01/24/2011  . GERD 09/15/2009  . Hyperlipidemia 03/21/2009  . BPH (benign prostatic hyperplasia) 03/18/2008  . HERNIATED DISC 12/11/2006    Current Outpatient Medications on File Prior to Visit  Medication Sig Dispense Refill  . Acetaminophen 500 MG coapsule     . bifidobacterium infantis (ALIGN) capsule     . Cholecalciferol (VITAMIN D3) 400 UNITS CAPS Take by mouth daily.    Marland Kitchen ECHINACEA PO Take by mouth 2 (two) times daily as needed (immune support). Reported on 11/02/2015    . Multiple Vitamin (MULTIVITAMIN) tablet Take 1 tablet by mouth daily.      . OMEGA-3 FATTY ACIDS-VITAMIN E PO     . Saw Palmetto 450 MG CAPS Take by mouth. 1 by mouth two times daily    . vitamin C (ASCORBIC ACID) 500 MG tablet Take 500 mg by mouth daily.     No current facility-administered medications on file prior to visit.     Past Medical History:  Diagnosis Date  . GERD (gastroesophageal reflux disease)   . Hyperlipidemia     Past Surgical History:  Procedure Laterality Date  . COLONOSCOPY  2002    Ileitis on Biospy, Dr.Perry  . colonoscopy with polypectomy  12/20/11   sessile serrated polyp    Social History   Socioeconomic History  . Marital  status: Single    Spouse name: Not on file  . Number of children: Not on file  . Years of education: Not on file  . Highest education level: Not on file  Occupational History  . Occupation: Biomedical engineer  . Financial resource strain: Not on file  . Food insecurity    Worry: Not on file    Inability: Not on file  . Transportation needs    Medical: Not on file    Non-medical: Not on file  Tobacco Use  . Smoking status: Never Smoker  . Smokeless tobacco: Never Used  Substance and Sexual Activity  . Alcohol use: Yes    Alcohol/week: 1.0 standard drinks    Types: 1 Glasses of wine per week    Comment: Red wine 3-4x monthly    . Drug use: No  . Sexual activity: Not on file  Lifestyle  . Physical activity    Days per week: Not on file    Minutes per session: Not on file  . Stress: Not on file  Relationships  . Social Herbalist on phone: Not on file    Gets together: Not on file    Attends religious service: Not on file    Active member of club or organization: Not on file  Attends meetings of clubs or organizations: Not on file    Relationship status: Not on file  Other Topics Concern  . Not on file  Social History Narrative   Exercise - 5 days a week - cardio for 30-40 minutes    Family History  Problem Relation Age of Onset  . Colon cancer Paternal Grandmother 77  . Stroke Father 93       hemorrhagic  . Heart failure Father   . Heart attack Father 49  . Diabetes Father   . Hyperlipidemia Mother   . Diabetes Mother   . Other Mother        giant cell arteritis  . Heart attack Paternal Grandfather 82  . Heart attack Maternal Grandfather        in 27s  . Diabetes Maternal Grandmother   . Alzheimer's disease Maternal Grandmother   . Heart failure Daughter   . Stomach cancer Neg Hx   . Asthma Neg Hx   . COPD Neg Hx     Review of Systems  Constitutional: Negative for chills and fever.  HENT: Positive for postnasal drip (year round).    Eyes: Negative for visual disturbance.  Respiratory: Positive for cough (related to PND). Negative for shortness of breath and wheezing.   Cardiovascular: Negative for chest pain, palpitations and leg swelling.  Gastrointestinal: Positive for nausea (occ in the morning). Negative for abdominal pain, blood in stool, constipation and diarrhea.       Rare gerd  Genitourinary: Negative for dysuria and hematuria.  Musculoskeletal: Negative for arthralgias and back pain.  Skin: Negative for color change and rash.  Neurological: Positive for headaches (occ headaches behind eye). Negative for light-headedness.  Psychiatric/Behavioral: Negative for dysphoric mood. The patient is nervous/anxious.        Objective:   Vitals:   06/17/19 0905  BP: 104/62  Pulse: 67  Resp: 14  Temp: 98 F (36.7 C)  SpO2: 97%   Filed Weights   06/17/19 0905  Weight: 149 lb (67.6 kg)   Body mass index is 20.78 kg/m.  Wt Readings from Last 3 Encounters:  06/17/19 149 lb (67.6 kg)  12/22/18 154 lb 11.2 oz (70.2 kg)  03/09/18 160 lb (72.6 kg)     Physical Exam Constitutional: He appears well-developed and well-nourished. No distress.  HENT:  Head: Normocephalic and atraumatic.  Right Ear: External ear normal.  Left Ear: External ear normal.  Mouth/Throat: Oropharynx is clear and moist.  Normal ear canals and TM b/l  Eyes: Conjunctivae and EOM are normal.  Neck: Neck supple. No tracheal deviation present. No thyromegaly present.  No carotid bruit  Cardiovascular: Normal rate, regular rhythm, normal heart sounds and intact distal pulses.   No murmur heard. Pulmonary/Chest: Effort normal and breath sounds normal. No respiratory distress. He has no wheezes. He has no rales.  Abdominal: Soft. He exhibits no distension. There is no tenderness.  Genitourinary: deferred  Musculoskeletal: He exhibits no edema.  Lymphadenopathy:   He has no cervical adenopathy.  Skin: Skin is warm and dry. He is not  diaphoretic.  Psychiatric: He has a normal mood and affect. His behavior is normal.         Assessment & Plan:   Physical exam: Screening blood work  ordered Immunizations   Up to date  Colonoscopy   Due - will schedule Eye exams   Up to date  Exercise  Regular - yard work, some walking Weight   Normal BMI - discussed avoiding  more weight loss - will try to gain weight Skin   Sees derm, no concerns today Substance abuse   none  See Problem List for Assessment and Plan of chronic medical problems.   FU annually

## 2019-06-16 NOTE — Telephone Encounter (Signed)
Labs done and printed. Will fax to the preferred [facility]

## 2019-06-16 NOTE — Telephone Encounter (Signed)
Will you put in labs that you would like pt to get done at physical and I will fax them over to [facility].

## 2019-06-16 NOTE — Patient Instructions (Addendum)
Tests ordered today.   All other Health Maintenance issues reviewed.   All recommended immunizations and age-appropriate screenings are up-to-date or discussed.  No immunization administered today.   Medications reviewed and updated.  Changes include :  none    Schedule your colonoscopy.    Please followup in 1 year for a physical    Health Maintenance, Male Adopting a healthy lifestyle and getting preventive care are important in promoting health and wellness. Ask your health care provider about:  The right schedule for you to have regular tests and exams.  Things you can do on your own to prevent diseases and keep yourself healthy. What should I know about diet, weight, and exercise? Eat a healthy diet   Eat a diet that includes plenty of vegetables, fruits, low-fat dairy products, and lean protein.  Do not eat a lot of foods that are high in solid fats, added sugars, or sodium. Maintain a healthy weight Body mass index (BMI) is a measurement that can be used to identify possible weight problems. It estimates body fat based on height and weight. Your health care provider can help determine your BMI and help you achieve or maintain a healthy weight. Get regular exercise Get regular exercise. This is one of the most important things you can do for your health. Most adults should:  Exercise for at least 150 minutes each week. The exercise should increase your heart rate and make you sweat (moderate-intensity exercise).  Do strengthening exercises at least twice a week. This is in addition to the moderate-intensity exercise.  Spend less time sitting. Even light physical activity can be beneficial. Watch cholesterol and blood lipids Have your blood tested for lipids and cholesterol at 60 years of age, then have this test every 5 years. You may need to have your cholesterol levels checked more often if:  Your lipid or cholesterol levels are high.  You are older than 60 years  of age.  You are at high risk for heart disease. What should I know about cancer screening? Many types of cancers can be detected early and may often be prevented. Depending on your health history and family history, you may need to have cancer screening at various ages. This may include screening for:  Colorectal cancer.  Prostate cancer.  Skin cancer.  Lung cancer. What should I know about heart disease, diabetes, and high blood pressure? Blood pressure and heart disease  High blood pressure causes heart disease and increases the risk of stroke. This is more likely to develop in people who have high blood pressure readings, are of African descent, or are overweight.  Talk with your health care provider about your target blood pressure readings.  Have your blood pressure checked: ? Every 3-5 years if you are 60-93 years of age. ? Every year if you are 60 years old or older.  If you are between the ages of 60 and 72 and are a current or former smoker, ask your health care provider if you should have a one-time screening for abdominal aortic aneurysm (AAA). Diabetes Have regular diabetes screenings. This checks your fasting blood sugar level. Have the screening done:  Once every three years after age 10 if you are at a normal weight and have a low risk for diabetes.  More often and at a younger age if you are overweight or have a high risk for diabetes. What should I know about preventing infection? Hepatitis B If you have a higher risk for hepatitis  B, you should be screened for this virus. Talk with your health care provider to find out if you are at risk for hepatitis B infection. Hepatitis C Blood testing is recommended for:  Everyone born from [birth year] through 1967.  Anyone with known risk factors for hepatitis C. Sexually transmitted infections (STIs)  You should be screened each year for STIs, including gonorrhea and chlamydia, if: ? You are sexually active and are  younger than 60 years of age. ? You are older than 60 years of age and your health care provider tells you that you are at risk for this type of infection. ? Your sexual activity has changed since you were last screened, and you are at increased risk for chlamydia or gonorrhea. Ask your health care provider if you are at risk.  Ask your health care provider about whether you are at high risk for HIV. Your health care provider may recommend a prescription medicine to help prevent HIV infection. If you choose to take medicine to prevent HIV, you should first get tested for HIV. You should then be tested every 3 months for as long as you are taking the medicine. Follow these instructions at home: Lifestyle  Do not use any products that contain nicotine or tobacco, such as cigarettes, e-cigarettes, and chewing tobacco. If you need help quitting, ask your health care provider.  Do not use street drugs.  Do not share needles.  Ask your health care provider for help if you need support or information about quitting drugs. Alcohol use  Do not drink alcohol if your health care provider tells you not to drink.  If you drink alcohol: ? Limit how much you have to 0-2 drinks a day. ? Be aware of how much alcohol is in your drink. In the [place] one drink equals one 12 oz bottle of beer (355 mL), one 5 oz glass of wine (148 mL), or one 1 oz glass of hard liquor (44 mL). General instructions  Schedule regular health, dental, and eye exams.  Stay current with your vaccines.  Tell your health care provider if: ? You often feel depressed. ? You have ever been abused or do not feel safe at home. Summary  Adopting a healthy lifestyle and getting preventive care are important in promoting health and wellness.  Follow your health care provider's instructions about healthy diet, exercising, and getting tested or screened for diseases.  Follow your health care provider's instructions on monitoring your  cholesterol and blood pressure. This information is not intended to replace advice given to you by your health care provider. Make sure you discuss any questions you have with your health care provider. Document Released: 04/25/2008 Document Revised: 10/21/2018 Document Reviewed: 10/21/2018 Elsevier Patient Education  2020 [employer].

## 2019-06-17 ENCOUNTER — Ambulatory Visit (INDEPENDENT_AMBULATORY_CARE_PROVIDER_SITE_OTHER): Payer: 59 | Admitting: Internal Medicine

## 2019-06-17 ENCOUNTER — Encounter: Payer: Self-pay | Admitting: Internal Medicine

## 2019-06-17 ENCOUNTER — Other Ambulatory Visit: Payer: Self-pay

## 2019-06-17 VITALS — BP 104/62 | HR 67 | Temp 98.0°F | Resp 14 | Ht 71.0 in | Wt 149.0 lb

## 2019-06-17 DIAGNOSIS — R739 Hyperglycemia, unspecified: Secondary | ICD-10-CM

## 2019-06-17 DIAGNOSIS — Z114 Encounter for screening for human immunodeficiency virus [HIV]: Secondary | ICD-10-CM

## 2019-06-17 DIAGNOSIS — N400 Enlarged prostate without lower urinary tract symptoms: Secondary | ICD-10-CM

## 2019-06-17 DIAGNOSIS — Z0000 Encounter for general adult medical examination without abnormal findings: Secondary | ICD-10-CM | POA: Diagnosis not present

## 2019-06-17 DIAGNOSIS — E782 Mixed hyperlipidemia: Secondary | ICD-10-CM

## 2019-06-17 DIAGNOSIS — K219 Gastro-esophageal reflux disease without esophagitis: Secondary | ICD-10-CM

## 2019-06-17 DIAGNOSIS — Z1159 Encounter for screening for other viral diseases: Secondary | ICD-10-CM

## 2019-06-17 NOTE — Assessment & Plan Note (Signed)
occ GERD otc medication

## 2019-06-17 NOTE — Assessment & Plan Note (Signed)
Lifestyle controlled Lipids, cmp, tsh

## 2019-06-17 NOTE — Assessment & Plan Note (Signed)
psa Follows with urology

## 2019-06-17 NOTE — Assessment & Plan Note (Signed)
Strong family history a1c

## 2019-07-12 ENCOUNTER — Encounter: Payer: Self-pay | Admitting: Internal Medicine

## 2019-08-02 ENCOUNTER — Encounter: Payer: Self-pay | Admitting: Internal Medicine

## 2019-08-09 ENCOUNTER — Other Ambulatory Visit: Payer: Self-pay | Admitting: Internal Medicine

## 2019-08-10 ENCOUNTER — Encounter: Payer: Self-pay | Admitting: Internal Medicine

## 2019-08-10 LAB — CBC WITH DIFFERENTIAL/PLATELET
Basophils Absolute: 0 10*3/uL (ref 0.0–0.2)
Basos: 1 %
EOS (ABSOLUTE): 0.3 10*3/uL (ref 0.0–0.4)
Eos: 8 %
Hematocrit: 42.2 % (ref 37.5–51.0)
Hemoglobin: 14.5 g/dL (ref 13.0–17.7)
Immature Grans (Abs): 0 10*3/uL (ref 0.0–0.1)
Immature Granulocytes: 0 %
Lymphocytes Absolute: 1 10*3/uL (ref 0.7–3.1)
Lymphs: 25 %
MCH: 33.1 pg — ABNORMAL HIGH (ref 26.6–33.0)
MCHC: 34.4 g/dL (ref 31.5–35.7)
MCV: 96 fL (ref 79–97)
Monocytes Absolute: 0.4 10*3/uL (ref 0.1–0.9)
Monocytes: 10 %
Neutrophils Absolute: 2.2 10*3/uL (ref 1.4–7.0)
Neutrophils: 56 %
Platelets: 153 10*3/uL (ref 150–450)
RBC: 4.38 x10E6/uL (ref 4.14–5.80)
RDW: 11.5 % — ABNORMAL LOW (ref 11.6–15.4)
WBC: 3.9 10*3/uL (ref 3.4–10.8)

## 2019-08-10 LAB — COMPREHENSIVE METABOLIC PANEL
ALT: 17 IU/L (ref 0–44)
AST: 22 IU/L (ref 0–40)
Albumin/Globulin Ratio: 2.1 (ref 1.2–2.2)
Albumin: 4.4 g/dL (ref 3.8–4.9)
Alkaline Phosphatase: 70 IU/L (ref 39–117)
BUN/Creatinine Ratio: 23 (ref 10–24)
BUN: 23 mg/dL (ref 8–27)
Bilirubin Total: 0.5 mg/dL (ref 0.0–1.2)
CO2: 25 mmol/L (ref 20–29)
Calcium: 9.7 mg/dL (ref 8.6–10.2)
Chloride: 104 mmol/L (ref 96–106)
Creatinine, Ser: 1.02 mg/dL (ref 0.76–1.27)
GFR calc Af Amer: 92 mL/min/{1.73_m2} (ref >59)
GFR calc non Af Amer: 80 mL/min/{1.73_m2} (ref >59)
Globulin, Total: 2.1 g/dL (ref 1.5–4.5)
Glucose: 108 mg/dL — ABNORMAL HIGH (ref 65–99)
Potassium: 4.5 mmol/L (ref 3.5–5.2)
Sodium: 141 mmol/L (ref 134–144)
Total Protein: 6.5 g/dL (ref 6.0–8.5)

## 2019-08-10 LAB — HIV ANTIBODY (ROUTINE TESTING W REFLEX): HIV Screen 4th Generation wRfx: NONREACTIVE

## 2019-08-10 LAB — PSA, TOTAL AND FREE
PSA, Free Pct: 42.9 %
PSA, Free: 0.3 ng/mL
Prostate Specific Ag, Serum: 0.7 ng/mL (ref 0.0–4.0)

## 2019-08-10 LAB — HEPATITIS C ANTIBODY: Hep C Virus Ab: <0.1 s/co ratio (ref 0.0–0.9)

## 2019-08-10 LAB — LIPID PANEL
Chol/HDL Ratio: 3 ratio (ref 0.0–5.0)
Cholesterol, Total: 140 mg/dL (ref 100–199)
HDL: 47 mg/dL (ref >39)
LDL Chol Calc (NIH): 82 mg/dL (ref 0–99)
Triglycerides: 52 mg/dL (ref 0–149)
VLDL Cholesterol Cal: 11 mg/dL (ref 5–40)

## 2019-08-10 LAB — HEMOGLOBIN A1C
Est. average glucose Bld gHb Est-mCnc: 114 mg/dL
Hgb A1c MFr Bld: 5.6 % (ref 4.8–5.6)

## 2019-08-10 LAB — TSH: TSH: 3.32 u[IU]/mL (ref 0.450–4.500)

## 2019-09-06 ENCOUNTER — Encounter: Payer: Self-pay | Admitting: Internal Medicine

## 2019-10-12 ENCOUNTER — Encounter: Payer: Self-pay | Admitting: Internal Medicine

## 2019-10-29 ENCOUNTER — Other Ambulatory Visit: Payer: Self-pay | Admitting: *Deleted

## 2019-11-15 ENCOUNTER — Ambulatory Visit (AMBULATORY_SURGERY_CENTER): Payer: 59 | Admitting: *Deleted

## 2019-11-15 ENCOUNTER — Encounter: Payer: Self-pay | Admitting: Internal Medicine

## 2019-11-15 ENCOUNTER — Telehealth: Payer: Self-pay | Admitting: *Deleted

## 2019-11-15 ENCOUNTER — Other Ambulatory Visit: Payer: Self-pay

## 2019-11-15 VITALS — Temp 96.8°F | Ht 71.0 in | Wt 147.2 lb

## 2019-11-15 DIAGNOSIS — Z86010 Personal history of colonic polyps: Secondary | ICD-10-CM

## 2019-11-15 DIAGNOSIS — Z1159 Encounter for screening for other viral diseases: Secondary | ICD-10-CM

## 2019-11-15 MED ORDER — METOCLOPRAMIDE HCL 10 MG PO TABS: 10.0000 mg | ORAL_TABLET | ORAL | 0 refills | Status: DC

## 2019-11-15 MED ORDER — SUPREP BOWEL PREP KIT 17.5-3.13-1.6 GM/177ML PO SOLN: 1.0000 | Freq: Once | ORAL | 0 refills | Status: AC

## 2019-11-15 NOTE — Telephone Encounter (Signed)
Sent in Reglan 10 mg as directed to [facility]- Pt made aware  Lelan Pons PV

## 2019-11-15 NOTE — Progress Notes (Signed)
No egg or soy allergy known to patient  No issues with past sedation with any surgeries  or procedures, no past intubation   No diet pills per patient No home 02 use per patient  No blood thinners per patient  Pt denies issues with constipation  No A fib or A flutter  EMMI video sent to pt's e mail   TE to Dr Henrene Pastor about Reglan for colon prior to prep   Due to the COVID-19 pandemic we are asking patients to follow these guidelines. Please only bring one care partner. Please be aware that your care partner may wait in the car in the parking lot or if they feel like they will be too hot to wait in the car, they may wait in the lobby on the 4th floor. All care partners are required to wear a mask the entire time (we do not have any that we can provide them), they need to practice social distancing, and we will do a Covid check for all patient's and care partners when you arrive. Also we will check their temperature and your temperature. If the care partner waits in their car they need to stay in the parking lot the entire time and we will call them on their cell phone when the patient is ready for discharge so they can bring the car to the front of the building. Also all patient's will need to wear a mask into building. Suprep $15 coupon

## 2019-11-15 NOTE — Telephone Encounter (Signed)
Dr Henrene Pastor,  I saw Jeremy Ellis in [place] today- he has a colon scheduled with you on 11-29-2019- he told me at his last OV in 2018 you told him he can have Reglan 10 mg to take prior to his prep for his colon- It is documented in the note you prescribed Reglan but he states he did not get the Reglan at that time. Can he have Reglan for his colon?  Please advise, Thanks for your time,Marie

## 2019-11-15 NOTE — Telephone Encounter (Signed)
Yes.  Thank you.

## 2019-11-17 ENCOUNTER — Encounter: Payer: Self-pay | Admitting: Internal Medicine

## 2019-11-24 ENCOUNTER — Ambulatory Visit (INDEPENDENT_AMBULATORY_CARE_PROVIDER_SITE_OTHER): Payer: 59

## 2019-11-24 ENCOUNTER — Other Ambulatory Visit: Payer: Self-pay | Admitting: Internal Medicine

## 2019-11-24 DIAGNOSIS — Z1159 Encounter for screening for other viral diseases: Secondary | ICD-10-CM

## 2019-11-24 LAB — SARS CORONAVIRUS 2 (TAT 6-24 HRS): SARS Coronavirus 2: NEGATIVE

## 2019-11-29 ENCOUNTER — Ambulatory Visit (AMBULATORY_SURGERY_CENTER): Payer: 59 | Admitting: Internal Medicine

## 2019-11-29 ENCOUNTER — Other Ambulatory Visit: Payer: Self-pay

## 2019-11-29 ENCOUNTER — Encounter: Payer: Self-pay | Admitting: Internal Medicine

## 2019-11-29 VITALS — BP 103/79 | HR 74 | Temp 98.6°F | Resp 9 | Ht 71.0 in | Wt 147.2 lb

## 2019-11-29 DIAGNOSIS — Z86010 Personal history of colonic polyps: Secondary | ICD-10-CM

## 2019-11-29 MED ORDER — FLEET ENEMA 7-19 GM/118ML RE ENEM: 1.0000 | ENEMA | Freq: Once | RECTAL | Status: DC

## 2019-11-29 MED ORDER — SODIUM CHLORIDE 0.9 % IV SOLN: 500.0000 mL | Freq: Once | INTRAVENOUS | Status: DC

## 2019-11-29 NOTE — Op Note (Signed)
[facility] Patient Name: Jeremy Ellis Procedure Date: 11/29/2019 7:57 AM MRN: [id number] Endoscopist: Docia Chuck. Henrene Pastor , MD Age: 59 Referring MD:  Date of Birth: Apr 07, 1961 Gender: Male Account #: [id number] Procedure:                Colonoscopy Indications:              High risk colon cancer surveillance: Personal                            history of sessile serrated colon polyp (less than                            10 mm in size) with no dysplasia previous                            examinations 2002, 2013 Medicines:                Monitored Anesthesia Care Procedure:                Pre-Anesthesia Assessment:                           - Prior to the procedure, a History and Physical                            was performed, and patient medications and                            allergies were reviewed. The patient's tolerance of                            previous anesthesia was also reviewed. The risks                            and benefits of the procedure and the sedation                            options and risks were discussed with the patient.                            All questions were answered, and informed consent                            was obtained. Prior Anticoagulants: The patient has                            taken no previous anticoagulant or antiplatelet                            agents. ASA Grade Assessment: I - A normal, healthy                            patient. After reviewing the risks and benefits,  the patient was deemed in satisfactory condition to                            undergo the procedure.                           After obtaining informed consent, the colonoscope                            was passed under direct vision. Throughout the                            procedure, the patient's blood pressure, pulse, and                            oxygen saturations were monitored continuously. The                  Colonoscope was introduced through the anus and                            advanced to the the cecum, identified by                            appendiceal orifice and ileocecal valve. The                            ileocecal valve, appendiceal orifice, and rectum                            were photographed. The quality of the bowel                            preparation was excellent. The colonoscopy was                            performed without difficulty. The patient tolerated                            the procedure well. The bowel preparation used was                            SUPREP via split dose instruction. Scope In: 8:26:17 AM Scope Out: 8:40:04 AM Scope Withdrawal Time: 0 hours 10 minutes 43 seconds  Total Procedure Duration: 0 hours 13 minutes 47 seconds  Findings:                 Internal hemorrhoids and an incidental hypertrophic                            anal papilla were found during retroflexion.                           The exam was otherwise without abnormality on  direct and retroflexion views. Complications:            No immediate complications. Estimated blood loss:                            None. Estimated Blood Loss:     Estimated blood loss: none. Impression:               - Internal hemorrhoids.                           - The examination was otherwise normal on direct                            and retroflexion views.                           - No specimens collected. Recommendation:           - Repeat colonoscopy in 10 years for surveillance.                           - Patient has a contact number available for                            emergencies. The signs and symptoms of potential                            delayed complications were discussed with the                            patient. Return to normal activities tomorrow.                            Written discharge instructions were provided to the                             patient.                           - Resume previous diet.                           - Continue present medications. Docia Chuck. Henrene Pastor, MD 11/29/2019 8:47:04 AM This report has been signed electronically.

## 2019-11-29 NOTE — Progress Notes (Signed)
PT taken to PACU. Monitors in place. VSS. Report given to RN. 

## 2019-11-29 NOTE — Progress Notes (Signed)
Fleets Enema self-administered by pt. To ensure adequate prep., given inability to tolerate total dose of 2nd bottle of Suprep.  (pt. Drank 40-50%, was nauseated, called Dr. On call [facility] Plan)  Results - clear, with a bit of translucent brown matter.  NDC no. Of Fleets:  2860

## 2019-11-29 NOTE — Progress Notes (Signed)
Pt. Reports no change in his medical or surgical history since his pre-visit 11/15/2019.

## 2019-11-29 NOTE — Patient Instructions (Signed)
Handout on hemorrhoids given to you today   YOU HAD AN ENDOSCOPIC PROCEDURE TODAY AT THE [facility]:   Refer to the procedure report that was given to you for any specific questions about what was found during the examination.  If the procedure report does not answer your questions, please call your gastroenterologist to clarify.  If you requested that your care partner not be given the details of your procedure findings, then the procedure report has been included in a sealed envelope for you to review at your convenience later.  YOU SHOULD EXPECT: Some feelings of bloating in the abdomen. Passage of more gas than usual.  Walking can help get rid of the air that was put into your GI tract during the procedure and reduce the bloating. If you had a lower endoscopy (such as a colonoscopy or flexible sigmoidoscopy) you may notice spotting of blood in your stool or on the toilet paper. If you underwent a bowel prep for your procedure, you may not have a normal bowel movement for a few days.  Please Note:  You might notice some irritation and congestion in your nose or some drainage.  This is from the oxygen used during your procedure.  There is no need for concern and it should clear up in a day or so.  SYMPTOMS TO REPORT IMMEDIATELY:   Following lower endoscopy (colonoscopy or flexible sigmoidoscopy):  Excessive amounts of blood in the stool  Significant tenderness or worsening of abdominal pains  Swelling of the abdomen that is new, acute  Fever of 100F or higher  For urgent or emergent issues, a gastroenterologist can be reached at any hour by calling [phone].   DIET:  We do recommend a small meal at first, but then you may proceed to your regular diet.  Drink plenty of fluids but you should avoid alcoholic beverages for 24 hours.  ACTIVITY:  You should plan to take it easy for the rest of today and you should NOT DRIVE or use heavy machinery until tomorrow (because of the  sedation medicines used during the test).    FOLLOW UP: Our staff will call the number listed on your records 48-72 hours following your procedure to check on you and address any questions or concerns that you may have regarding the information given to you following your procedure. If we do not reach you, we will leave a message.  We will attempt to reach you two times.  During this call, we will ask if you have developed any symptoms of COVID 19. If you develop any symptoms (ie: fever, flu-like symptoms, shortness of breath, cough etc.) before then, please call [phone].  If you test positive for Covid 19 in the 2 weeks post procedure, please call and report this information to us.    If any biopsies were taken you will be contacted by phone or by letter within the next 1-3 weeks.  Please call us at [phone] if you have not heard about the biopsies in 3 weeks.    SIGNATURES/CONFIDENTIALITY: You and/or your care partner have signed paperwork which will be entered into your electronic medical record.  These signatures attest to the fact that that the information above on your After Visit Summary has been reviewed and is understood.  Full responsibility of the confidentiality of this discharge information lies with you and/or your care-partner. 

## 2019-12-01 ENCOUNTER — Telehealth: Payer: Self-pay

## 2019-12-01 NOTE — Telephone Encounter (Signed)
Left message on follow up call. 

## 2019-12-01 NOTE — Telephone Encounter (Signed)
  Follow up Call-  Call back number 11/29/2019  Post procedure Call Back phone  # [phone]  Permission to leave phone message Yes  Some recent data might be hidden     Patient questions:  Do you have a fever, pain , or abdominal swelling? No. Pain Score  0 *  Have you tolerated food without any problems? Yes.    Have you been able to return to your normal activities? Yes.    Do you have any questions about your discharge instructions: Diet   No. Medications  No. Follow up visit  No.  Do you have questions or concerns about your Care? No.  Actions: * If pain score is 4 or above: No action needed, pain <4.  1. Have you developed a fever since your procedure? no  2.   Have you had an respiratory symptoms (SOB or cough) since your procedure? no  3.   Have you tested positive for COVID 19 since your procedure no  4.   Have you had any family members/close contacts diagnosed with the COVID 19 since your procedure?  no   If yes to any of these questions please route to Joylene John, RN and Alphonsa Gin, Therapist, sports.

## 2020-01-09 ENCOUNTER — Ambulatory Visit: Payer: 59 | Attending: Internal Medicine

## 2020-01-09 DIAGNOSIS — Z23 Encounter for immunization: Secondary | ICD-10-CM

## 2020-01-09 NOTE — Progress Notes (Signed)
   Covid-19 Vaccination Clinic  Name:  Jeremy Ellis    MRN: [id number] DOB: Apr 19, 1961  01/09/2020  Jeremy Ellis was observed post Covid-19 immunization for 15 minutes without incidence. He was provided with Vaccine Information Sheet and instruction to access the V-Safe system.   Jeremy Ellis was instructed to call 911 with any severe reactions post vaccine: Marland Kitchen Difficulty breathing  . Swelling of your face and throat  . A fast heartbeat  . A bad rash all over your body  . Dizziness and weakness    Immunizations Administered    Name Date Dose VIS Date Route   Pfizer COVID-19 Vaccine 01/09/2020  9:32 AM 0.3 mL 10/22/2019 Intramuscular   Manufacturer: [place]   Lot: [id number]   [place]: [id number]

## 2020-02-04 ENCOUNTER — Other Ambulatory Visit: Payer: Self-pay

## 2020-02-04 ENCOUNTER — Encounter: Payer: Self-pay | Admitting: Internal Medicine

## 2020-02-04 ENCOUNTER — Ambulatory Visit: Payer: 59 | Admitting: Internal Medicine

## 2020-02-04 DIAGNOSIS — F4321 Adjustment disorder with depressed mood: Secondary | ICD-10-CM | POA: Insufficient documentation

## 2020-02-04 DIAGNOSIS — K219 Gastro-esophageal reflux disease without esophagitis: Secondary | ICD-10-CM

## 2020-02-04 MED ORDER — FAMOTIDINE 40 MG PO TABS: 40.0000 mg | ORAL_TABLET | Freq: Every day | ORAL | 3 refills | Status: DC | PRN

## 2020-02-04 MED ORDER — LORAZEPAM 0.5 MG PO TABS: 0.2500 mg | ORAL_TABLET | Freq: Every evening | ORAL | 0 refills | Status: DC | PRN

## 2020-02-04 NOTE — Patient Instructions (Signed)
   Medications reviewed and updated.  Changes include :   pepcid daily for increased acid.  You can stop this after a couple of weeks if you do not need it.   Ativan at night as needed for sleep.    Your prescription(s) have been submitted to your pharmacy. Please take as directed and contact our office if you believe you are having problem(s) with the medication(s).    Let me know if you have any questions or concerns.

## 2020-02-04 NOTE — Assessment & Plan Note (Signed)
Throat irritation and throat clearing new with increased stress since yesterday Likely GERD Start pepcid 40 mg daily - can use for short time period and then discontinue

## 2020-02-04 NOTE — Assessment & Plan Note (Signed)
Acute Mom died unexpectedly this morning after falling ill yesterday Having difficulty sleeping, anxiety, sadness, decreased appetite Will try ativan at night prn - ok to take for a few days and then hopefully prn May need SSRI depending on how he does Encouraged him to use his family and consider counseling He will update me on how he is doing and if he needs anything

## 2020-02-04 NOTE — Progress Notes (Signed)
Subjective:    Patient ID: Jeremy Ellis, male    DOB: 21-Aug-1961, 59 y.o.   MRN: [id number]  HPI The patient is here for an acute visit.   His mom, who is also my patient, died suddenly this morning.  She was doing well, but yesterday morning she had sudden onset of throat pain and did not feel well.  She passed out.  She was diagnosed with aortic dissection, large pericardial effusion and died early this morning.   He feels he will need something to help him get through this.  He wants to be able to sleep.  He is concerned about not being hungry.  He has throat irritation, clearing of the throat that just started.   He does not want any long term medication.  He does want to return to work next week.   Medications and allergies reviewed with patient and updated if appropriate.  Patient Active Problem List   Diagnosis Date Noted  . Vertigo 12/22/2018  . Sinus pressure 09/01/2017  . Rosacea 05/15/2017  . Allergic rhinitis 05/15/2017  . Hyperglycemia 11/06/2016  . Raynaud's phenomenon 06/01/2015  . Personal history of colonic polyps 03/14/2014  . ILEITIS 01/24/2011  . GERD 09/15/2009  . Hyperlipidemia 03/21/2009  . BPH (benign prostatic hyperplasia) 03/18/2008  . HERNIATED DISC 12/11/2006    Current Outpatient Medications on File Prior to Visit  Medication Sig Dispense Refill  . Acetaminophen 500 MG coapsule     . augmented betamethasone dipropionate (DIPROLENE-AF) 0.05 % cream APPLY TO AFFECTED AREAS TWICE A DAY FOR 1 3 WEEKS FOR INFLAMMATION    . bifidobacterium infantis (ALIGN) capsule Every other day    . Cholecalciferol (VITAMIN D3) 400 UNITS CAPS Take by mouth daily.    Marland Kitchen ECHINACEA PO Take by mouth 2 (two) times daily as needed (immune support). Reported on 11/02/2015    . metoCLOPramide (REGLAN) 10 MG tablet Take 1 tablet (10 mg total) by mouth as directed. 2 tablet 0  . Multiple Vitamin (MULTIVITAMIN) tablet Take 1 tablet by mouth daily.      . OMEGA-3 FATTY  ACIDS-VITAMIN E PO     . Saw Palmetto 450 MG CAPS Take by mouth. 1 by mouth two times daily    . vitamin C (ASCORBIC ACID) 500 MG tablet Take 500 mg by mouth daily.     Current Facility-Administered Medications on File Prior to Visit  Medication Dose Route Frequency Provider Last Rate Last Admin  . sodium phosphate (FLEET) 7-19 GM/118ML enema 1 enema  1 enema Rectal Once Irene Shipper, MD        Past Medical History:  Diagnosis Date  . Allergy   . Anxiety    situational in 2020-  . Arthritis   . GERD (gastroesophageal reflux disease)   . Hyperlipidemia    fam hx- no meds     Past Surgical History:  Procedure Laterality Date  . COLONOSCOPY  2002    Ileitis on Biospy, Dr.Perry  . colonoscopy with polypectomy  12/20/11   sessile serrated polyp  . POLYPECTOMY      Social History   Socioeconomic History  . Marital status: Single    Spouse name: Not on file  . Number of children: Not on file  . Years of education: Not on file  . Highest education level: Not on file  Occupational History  . Occupation: Personal assistant  Tobacco Use  . Smoking status: Never Smoker  . Smokeless tobacco: Never Used  Substance  and Sexual Activity  . Alcohol use: Yes    Alcohol/week: 1.0 standard drinks    Types: 1 Glasses of wine per week    Comment: Red wine 3-4x monthly    . Drug use: No  . Sexual activity: Not on file  Other Topics Concern  . Not on file  Social History Narrative   Exercise - 5 days a week - cardio for 30-40 minutes   Social Determinants of Health   Financial Resource Strain:   . Difficulty of Paying Living Expenses:   Food Insecurity:   . Worried About Charity fundraiser in the Last Year:   . Arboriculturist in the Last Year:   Transportation Needs:   . Film/video editor (Medical):   Marland Kitchen Lack of Transportation (Non-Medical):   Physical Activity:   . Days of Exercise per Week:   . Minutes of Exercise per Session:   Stress:   . Feeling of Stress :   Social  Connections:   . Frequency of Communication with Friends and Family:   . Frequency of Social Gatherings with Friends and Family:   . Attends Religious Services:   . Active Member of Clubs or Organizations:   . Attends Archivist Meetings:   Marland Kitchen Marital Status:     Family History  Problem Relation Age of Onset  . Colon cancer Paternal Grandmother 2  . Stroke Father 62       hemorrhagic  . Heart failure Father   . Heart attack Father 52  . Diabetes Father   . Hyperlipidemia Mother   . Diabetes Mother   . Other Mother        giant cell arteritis  . Heart attack Paternal Grandfather 49  . Heart attack Maternal Grandfather        in 36s  . Diabetes Maternal Grandmother   . Alzheimer's disease Maternal Grandmother   . Heart failure Daughter   . Stomach cancer Neg Hx   . Asthma Neg Hx   . COPD Neg Hx   . Colon polyps Neg Hx   . Esophageal cancer Neg Hx   . Rectal cancer Neg Hx     Review of Systems  Constitutional: Positive for appetite change.  HENT:       Throat clearing, and irritation  Psychiatric/Behavioral: Positive for sleep disturbance.       Objective:   Vitals:   02/04/20 1304  BP: 124/78  Pulse: 87  Resp: 16  Temp: 98.8 F (37.1 C)  SpO2: 96%   BP Readings from Last 3 Encounters:  02/04/20 124/78  11/29/19 103/79  06/17/19 104/62   Wt Readings from Last 3 Encounters:  02/04/20 146 lb (66.2 kg)  11/29/19 147 lb 3.2 oz (66.8 kg)  11/15/19 147 lb 3.2 oz (66.8 kg)   Body mass index is 20.36 kg/m.   Physical Exam Constitutional:      General: He is not in acute distress.    Appearance: Normal appearance. He is not ill-appearing.  HENT:     Head: Normocephalic and atraumatic.  Skin:    General: Skin is warm and dry.  Neurological:     Mental Status: He is alert.  Psychiatric:        Mood and Affect: Mood normal.        Behavior: Behavior normal.        Thought Content: Thought content normal.        Judgment: Judgment normal.  Assessment & Plan:    See Problem List for Assessment and Plan of chronic medical problems.    This visit occurred during the SARS-CoV-2 public health emergency.  Safety protocols were in place, including screening questions prior to the visit, additional usage of staff PPE, and extensive cleaning of exam room while observing appropriate contact time as indicated for disinfecting solutions.

## 2020-02-04 NOTE — Telephone Encounter (Signed)
    Scheduler attempted to contact patient several times to confirm appointment, virtual or in office

## 2020-02-04 NOTE — Telephone Encounter (Signed)
Please over ride appt for 1pm today. Pt mother passed away and Dr. Quay Burow has agreed to see pt at 1pm today.   Not sure if it is a virtual or in office. We will work that out later.

## 2020-02-05 ENCOUNTER — Ambulatory Visit: Payer: 59

## 2020-02-08 ENCOUNTER — Ambulatory Visit: Payer: 59 | Attending: Internal Medicine

## 2020-02-08 ENCOUNTER — Encounter: Payer: Self-pay | Admitting: Internal Medicine

## 2020-02-08 DIAGNOSIS — Z23 Encounter for immunization: Secondary | ICD-10-CM

## 2020-02-08 NOTE — Progress Notes (Signed)
   Covid-19 Vaccination Clinic  Name:  Jeremy Ellis    MRN: [id number] DOB: 1961-06-30  02/08/2020  [name] was observed post Covid-19 immunization for 15 minutes without incident. He was provided with Vaccine Information Sheet and instruction to access the V-Safe system.   [name] was instructed to call 911 with any severe reactions post vaccine: Marland Kitchen Difficulty breathing  . Swelling of face and throat  . A fast heartbeat  . A bad rash all over body  . Dizziness and weakness   Immunizations Administered    Name Date Dose VIS Date Route   Pfizer COVID-19 Vaccine 02/08/2020  1:17 PM 0.3 mL 10/22/2019 Intramuscular   Manufacturer: [place]   Lot: [id number]   [place]: [id number]

## 2020-02-15 ENCOUNTER — Encounter: Payer: Self-pay | Admitting: Internal Medicine

## 2020-02-28 ENCOUNTER — Encounter: Payer: Self-pay | Admitting: Internal Medicine

## 2020-03-03 ENCOUNTER — Encounter: Payer: Self-pay | Admitting: Internal Medicine

## 2020-03-06 ENCOUNTER — Encounter: Payer: Self-pay | Admitting: Internal Medicine

## 2020-03-07 MED ORDER — HYDROCORTISONE ACETATE 25 MG RE SUPP: 25.0000 mg | Freq: Two times a day (BID) | RECTAL | 5 refills | Status: DC

## 2020-03-07 MED ORDER — HYDROCORTISONE (PERIANAL) 2.5 % EX CREA: 1.0000 "application " | TOPICAL_CREAM | Freq: Two times a day (BID) | CUTANEOUS | 5 refills | Status: DC

## 2020-03-21 ENCOUNTER — Encounter: Payer: Self-pay | Admitting: Internal Medicine

## 2020-03-23 NOTE — Progress Notes (Signed)
Subjective:    Patient ID: Jeremy Ellis, male    DOB: 08-21-1961, 59 y.o.   MRN: [id number]  HPI The patient is here for an acute visit.  Weight loss:  His mother died unexpectedly less than 2 months ago and he has been under a lot of stress.  Initially his appetite was reduced and he was not eating much, but recently he has been eating well, but is still losing weight. He has lost 6 lbs in that time.  He is very very busy at work and very busy at home.  Work is stressful and he is trying to settle her estate.  He plans on selling the house they lived in and moving.  On top of all this his sister and him and having issues.    He is concerned about the weight loss.  He has been talking with his pastor.     His gerd is controlled.  The increase in gerd was likely from stress.  We started pepcid daily when he was here last.  He is now taking it as needed - he still has some gerd and advised him to take as needed.  Medications and allergies reviewed with patient and updated if appropriate.  Patient Active Problem List   Diagnosis Date Noted  . Weight loss 03/24/2020  . Grief 02/04/2020  . Vertigo 12/22/2018  . Sinus pressure 09/01/2017  . Rosacea 05/15/2017  . Allergic rhinitis 05/15/2017  . Hyperglycemia 11/06/2016  . Raynaud's phenomenon 06/01/2015  . Personal history of colonic polyps 03/14/2014  . ILEITIS 01/24/2011  . GERD 09/15/2009  . Hyperlipidemia 03/21/2009  . BPH (benign prostatic hyperplasia) 03/18/2008  . HERNIATED DISC 12/11/2006    Current Outpatient Medications on File Prior to Visit  Medication Sig Dispense Refill  . Acetaminophen 500 MG coapsule     . augmented betamethasone dipropionate (DIPROLENE-AF) 0.05 % cream APPLY TO AFFECTED AREAS TWICE A DAY FOR 1 3 WEEKS FOR INFLAMMATION    . bifidobacterium infantis (ALIGN) capsule Every other day    . Cholecalciferol (VITAMIN D3) 400 UNITS CAPS Take by mouth daily.    Marland Kitchen ECHINACEA PO Take by mouth 2 (two)  times daily as needed (immune support). Reported on 11/02/2015    . famotidine (PEPCID) 40 MG tablet Take 1 tablet (40 mg total) by mouth daily as needed for heartburn or indigestion. 30 tablet 3  . hydrocortisone (ANUSOL-HC) 2.5 % rectal cream Place 1 application rectally 2 (two) times daily. 30 g 5  . hydrocortisone (ANUSOL-HC) 25 MG suppository Place 1 suppository (25 mg total) rectally 2 (two) times daily. 12 suppository 5  . Multiple Vitamin (MULTIVITAMIN) tablet Take 1 tablet by mouth daily.      . OMEGA-3 FATTY ACIDS-VITAMIN E PO     . Saw Palmetto 450 MG CAPS Take by mouth. 1 by mouth two times daily    . vitamin C (ASCORBIC ACID) 500 MG tablet Take 500 mg by mouth daily.     Current Facility-Administered Medications on File Prior to Visit  Medication Dose Route Frequency Provider Last Rate Last Admin  . sodium phosphate (FLEET) 7-19 GM/118ML enema 1 enema  1 enema Rectal Once Irene Shipper, MD        Past Medical History:  Diagnosis Date  . Allergy   . Anxiety    situational in 2020-  . Arthritis   . GERD (gastroesophageal reflux disease)   . Hyperlipidemia    fam hx- no meds  Past Surgical History:  Procedure Laterality Date  . COLONOSCOPY  2002    Ileitis on Biospy, Dr.Perry  . colonoscopy with polypectomy  12/20/11   sessile serrated polyp  . POLYPECTOMY      Social History   Socioeconomic History  . Marital status: Single    Spouse name: Not on file  . Number of children: Not on file  . Years of education: Not on file  . Highest education level: Not on file  Occupational History  . Occupation: Personal assistant  Tobacco Use  . Smoking status: Never Smoker  . Smokeless tobacco: Never Used  Substance and Sexual Activity  . Alcohol use: Yes    Alcohol/week: 1.0 standard drinks    Types: 1 Glasses of wine per week    Comment: Red wine 3-4x monthly    . Drug use: No  . Sexual activity: Not on file  Other Topics Concern  . Not on file  Social History Narrative     Exercise - 5 days a week - cardio for 30-40 minutes   Social Determinants of Health   Financial Resource Strain:   . Difficulty of Paying Living Expenses:   Food Insecurity:   . Worried About Charity fundraiser in the Last Year:   . Arboriculturist in the Last Year:   Transportation Needs:   . Film/video editor (Medical):   Marland Kitchen Lack of Transportation (Non-Medical):   Physical Activity:   . Days of Exercise per Week:   . Minutes of Exercise per Session:   Stress:   . Feeling of Stress :   Social Connections:   . Frequency of Communication with Friends and Family:   . Frequency of Social Gatherings with Friends and Family:   . Attends Religious Services:   . Active Member of Clubs or Organizations:   . Attends Archivist Meetings:   Marland Kitchen Marital Status:     Family History  Problem Relation Age of Onset  . Colon cancer Paternal Grandmother 31  . Stroke Father 80       hemorrhagic  . Heart failure Father   . Heart attack Father 77  . Diabetes Father   . Hyperlipidemia Mother   . Diabetes Mother   . Other Mother        giant cell arteritis  . Heart attack Paternal Grandfather 76  . Heart attack Maternal Grandfather        in 80s  . Diabetes Maternal Grandmother   . Alzheimer's disease Maternal Grandmother   . Heart failure Daughter   . Stomach cancer Neg Hx   . Asthma Neg Hx   . COPD Neg Hx   . Colon polyps Neg Hx   . Esophageal cancer Neg Hx   . Rectal cancer Neg Hx     Review of Systems  Constitutional: Negative for appetite change.  HENT: Positive for postnasal drip.   Respiratory: Negative for cough and shortness of breath.   Cardiovascular: Negative for chest pain and palpitations.  Gastrointestinal: Negative for abdominal pain, blood in stool, constipation and diarrhea.       Occ gerd  Neurological: Positive for light-headedness. Negative for dizziness and headaches.  Psychiatric/Behavioral: Negative for sleep disturbance.        Objective:   Vitals:   03/24/20 0745  BP: 102/70  Pulse: 78  Resp: 16  Temp: 98.5 F (36.9 C)  SpO2: 98%   BP Readings from Last 3 Encounters:  03/24/20 102/70  02/04/20 124/78  11/29/19 103/79   Wt Readings from Last 3 Encounters:  03/24/20 140 lb 3.2 oz (63.6 kg)  02/04/20 146 lb (66.2 kg)  11/29/19 147 lb 3.2 oz (66.8 kg)   Body mass index is 19.55 kg/m.   Physical Exam    Constitutional: Appears well-developed and well-nourished. No distress.  Head: Normocephalic and atraumatic.  Neck: Neck supple. No tracheal deviation present. No thyromegaly present.  No cervical lymphadenopathy Cardiovascular: Normal rate, regular rhythm and normal heart sounds.  No murmur heard. No carotid bruit .  No edema Pulmonary/Chest: Effort normal and breath sounds normal. No respiratory distress. No has no wheezes. No rales.  Skin: Skin is warm and dry. Not diaphoretic.  Psychiatric: Normal mood and affect. Behavior is normal.       Assessment & Plan:    See Problem List for Assessment and Plan of chronic medical problems.    This visit occurred during the SARS-CoV-2 public health emergency.  Safety protocols were in place, including screening questions prior to the visit, additional usage of staff PPE, and extensive cleaning of exam room while observing appropriate contact time as indicated for disinfecting solutions.

## 2020-03-24 ENCOUNTER — Encounter: Payer: Self-pay | Admitting: Internal Medicine

## 2020-03-24 ENCOUNTER — Other Ambulatory Visit: Payer: Self-pay

## 2020-03-24 ENCOUNTER — Ambulatory Visit: Payer: 59 | Admitting: Internal Medicine

## 2020-03-24 DIAGNOSIS — K219 Gastro-esophageal reflux disease without esophagitis: Secondary | ICD-10-CM

## 2020-03-24 DIAGNOSIS — F4321 Adjustment disorder with depressed mood: Secondary | ICD-10-CM

## 2020-03-24 DIAGNOSIS — R634 Abnormal weight loss: Secondary | ICD-10-CM | POA: Insufficient documentation

## 2020-03-24 LAB — TSH: TSH: 1.8 u[IU]/mL (ref 0.35–4.50)

## 2020-03-24 LAB — CBC WITH DIFFERENTIAL/PLATELET
Basophils Absolute: 0 10*3/uL (ref 0.0–0.1)
Basophils Relative: 0.6 % (ref 0.0–3.0)
Eosinophils Absolute: 0.5 10*3/uL (ref 0.0–0.7)
Eosinophils Relative: 10.5 % — ABNORMAL HIGH (ref 0.0–5.0)
HCT: 42.8 % (ref 39.0–52.0)
Hemoglobin: 14.7 g/dL (ref 13.0–17.0)
Lymphocytes Relative: 17.7 % (ref 12.0–46.0)
Lymphs Abs: 0.8 10*3/uL (ref 0.7–4.0)
MCHC: 34.2 g/dL (ref 30.0–36.0)
MCV: 99.6 fl (ref 78.0–100.0)
Monocytes Absolute: 0.5 10*3/uL (ref 0.1–1.0)
Monocytes Relative: 11 % (ref 3.0–12.0)
Neutro Abs: 2.7 10*3/uL (ref 1.4–7.7)
Neutrophils Relative %: 60.2 % (ref 43.0–77.0)
Platelets: 169 10*3/uL (ref 150.0–400.0)
RBC: 4.3 Mil/uL (ref 4.22–5.81)
RDW: 12.8 % (ref 11.5–15.5)
WBC: 4.5 10*3/uL (ref 4.0–10.5)

## 2020-03-24 LAB — COMPREHENSIVE METABOLIC PANEL
ALT: 18 U/L (ref 0–53)
AST: 22 U/L (ref 0–37)
Albumin: 4.3 g/dL (ref 3.5–5.2)
Alkaline Phosphatase: 48 U/L (ref 39–117)
BUN: 18 mg/dL (ref 6–23)
CO2: 32 mEq/L (ref 19–32)
Calcium: 9.4 mg/dL (ref 8.4–10.5)
Chloride: 102 mEq/L (ref 96–112)
Creatinine, Ser: 0.92 mg/dL (ref 0.40–1.50)
GFR: 83.63 mL/min (ref >60.00)
Glucose, Bld: 101 mg/dL — ABNORMAL HIGH (ref 70–99)
Potassium: 4.6 mEq/L (ref 3.5–5.1)
Sodium: 138 mEq/L (ref 135–145)
Total Bilirubin: 0.6 mg/dL (ref 0.2–1.2)
Total Protein: 6.6 g/dL (ref 6.0–8.3)

## 2020-03-24 NOTE — Assessment & Plan Note (Signed)
Chronic  Intermittent gerd Continue pepcid 40 mg as needed, but advised to take for a few days when needed if gerd flares

## 2020-03-24 NOTE — Patient Instructions (Addendum)
  Blood work was ordered.      Medications reviewed and updated.  Changes include :   none      

## 2020-03-24 NOTE — Assessment & Plan Note (Signed)
His mother died less than 2 months ago and he is still dealing with losing her and is working on settling her estate He is now sleeping ok and appetite is good He does have high anxiety/stress likely the cause of his weight loss Deferred SSRI Talking to his pastor Will let me know if he needs anything

## 2020-03-24 NOTE — Assessment & Plan Note (Addendum)
Acute Weight loss of about 6 lbs in less than 2 months HM screening up to date The weight loss occurred just after his mother died unexpectedly and his stress level has been much high.  He initially was not eating much, but is now eating a good amount, but is very active and busy which is likely causing the weight loss He is talking to his pastor for support He never took any ativan and is high functioning.  At this time he does not want an SSRI We discussed he has a few stressful months ahead Will check basic labs to rule out other causes of weight loss - cbc, cmp, tsh He will let me know if he changes his mind on medication

## 2020-03-26 ENCOUNTER — Encounter: Payer: Self-pay | Admitting: Internal Medicine

## 2020-04-11 ENCOUNTER — Encounter: Payer: Self-pay | Admitting: Internal Medicine

## 2020-04-18 NOTE — Progress Notes (Signed)
Subjective:    Patient ID: Jeremy Ellis, male    DOB: 1960/11/23, 59 y.o.   MRN: [id number]  HPI The patient is here for an acute visit.  L ear painful to touch -  He has pain in the past 2 weeks.  It hurts to press on the external ear and lays on it.  It has some pain presses under the ear as well.  It feels clogged and has decreased hearing when pressing on the tragus.   Medications and allergies reviewed with patient and updated if appropriate.  Patient Active Problem List   Diagnosis Date Noted  . Weight loss 03/24/2020  . Grief 02/04/2020  . Vertigo 12/22/2018  . Sinus pressure 09/01/2017  . Rosacea 05/15/2017  . Allergic rhinitis 05/15/2017  . Hyperglycemia 11/06/2016  . Raynaud's phenomenon 06/01/2015  . Personal history of colonic polyps 03/14/2014  . ILEITIS 01/24/2011  . GERD 09/15/2009  . Hyperlipidemia 03/21/2009  . BPH (benign prostatic hyperplasia) 03/18/2008  . HERNIATED DISC 12/11/2006    Current Outpatient Medications on File Prior to Visit  Medication Sig Dispense Refill  . Acetaminophen 500 MG coapsule     . augmented betamethasone dipropionate (DIPROLENE-AF) 0.05 % cream APPLY TO AFFECTED AREAS TWICE A DAY FOR 1 3 WEEKS FOR INFLAMMATION    . bifidobacterium infantis (ALIGN) capsule Every other day    . Cholecalciferol (VITAMIN D3) 400 UNITS CAPS Take by mouth daily.    Marland Kitchen ECHINACEA PO Take by mouth 2 (two) times daily as needed (immune support). Reported on 11/02/2015    . famotidine (PEPCID) 40 MG tablet Take 1 tablet (40 mg total) by mouth daily as needed for heartburn or indigestion. 30 tablet 3  . hydrocortisone (ANUSOL-HC) 2.5 % rectal cream Place 1 application rectally 2 (two) times daily. 30 g 5  . hydrocortisone (ANUSOL-HC) 25 MG suppository Place 1 suppository (25 mg total) rectally 2 (two) times daily. 12 suppository 5  . Multiple Vitamin (MULTIVITAMIN) tablet Take 1 tablet by mouth daily.      . OMEGA-3 FATTY ACIDS-VITAMIN E PO     .  Saw Palmetto 450 MG CAPS Take by mouth. 1 by mouth two times daily    . vitamin C (ASCORBIC ACID) 500 MG tablet Take 500 mg by mouth daily.     No current facility-administered medications on file prior to visit.    Past Medical History:  Diagnosis Date  . Allergy   . Anxiety    situational in 2020-  . Arthritis   . GERD (gastroesophageal reflux disease)   . Hyperlipidemia    fam hx- no meds     Past Surgical History:  Procedure Laterality Date  . COLONOSCOPY  2002    Ileitis on Biospy, Dr.Perry  . colonoscopy with polypectomy  12/20/11   sessile serrated polyp  . POLYPECTOMY      Social History   Socioeconomic History  . Marital status: Single    Spouse name: Not on file  . Number of children: Not on file  . Years of education: Not on file  . Highest education level: Not on file  Occupational History  . Occupation: Personal assistant  Tobacco Use  . Smoking status: Never Smoker  . Smokeless tobacco: Never Used  Substance and Sexual Activity  . Alcohol use: Yes    Alcohol/week: 1.0 standard drinks    Types: 1 Glasses of wine per week    Comment: Red wine 3-4x monthly    . Drug use:  No  . Sexual activity: Not on file  Other Topics Concern  . Not on file  Social History Narrative   Exercise - 5 days a week - cardio for 30-40 minutes   Social Determinants of Health   Financial Resource Strain:   . Difficulty of Paying Living Expenses:   Food Insecurity:   . Worried About Charity fundraiser in the Last Year:   . Arboriculturist in the Last Year:   Transportation Needs:   . Film/video editor (Medical):   Marland Kitchen Lack of Transportation (Non-Medical):   Physical Activity:   . Days of Exercise per Week:   . Minutes of Exercise per Session:   Stress:   . Feeling of Stress :   Social Connections:   . Frequency of Communication with Friends and Family:   . Frequency of Social Gatherings with Friends and Family:   . Attends Religious Services:   . Active Member of Clubs  or Organizations:   . Attends Archivist Meetings:   Marland Kitchen Marital Status:     Family History  Problem Relation Age of Onset  . Colon cancer Paternal Grandmother 82  . Stroke Father 39       hemorrhagic  . Heart failure Father   . Heart attack Father 68  . Diabetes Father   . Hyperlipidemia Mother   . Diabetes Mother   . Other Mother        giant cell arteritis  . Heart attack Paternal Grandfather 16  . Heart attack Maternal Grandfather        in 90s  . Diabetes Maternal Grandmother   . Alzheimer's disease Maternal Grandmother   . Heart failure Daughter   . Stomach cancer Neg Hx   . Asthma Neg Hx   . COPD Neg Hx   . Colon polyps Neg Hx   . Esophageal cancer Neg Hx   . Rectal cancer Neg Hx     Review of Systems  Constitutional: Negative for chills and fever.  HENT: Positive for ear pain, hearing loss and postnasal drip. Negative for congestion, ear discharge, sinus pressure, sinus pain, sore throat and tinnitus.   Neurological: Positive for dizziness (with quick movements). Negative for headaches.       Objective:   Vitals:   04/19/20 0830  BP: 112/70  Pulse: 61  Temp: 98.3 F (36.8 C)  SpO2: 98%   BP Readings from Last 3 Encounters:  04/19/20 112/70  03/24/20 102/70  02/04/20 124/78   Wt Readings from Last 3 Encounters:  04/19/20 138 lb 12.8 oz (63 kg)  03/24/20 140 lb 3.2 oz (63.6 kg)  02/04/20 146 lb (66.2 kg)   Body mass index is 19.36 kg/m.   Physical Exam    Gen: well appearing in no acute distress HEENT: no cervical lymphadenopathy, mouth moist w/o erythema  PRE-PROCEDURE EXAM: Left TM cannot be visualized due to total occlusion/impaction of the ear canal. PROCEDURE INDICATION: remove wax to visualize ear drum & relieve discomfort CONSENT:  Verbal  PROCEDURE NOTE:   LEFT EAR:  The CMA used a metal wax curette under direct vision with an otoscope to free the wax bolus from the ear wall and then successfully removed a small bit of wax.  The ear was then irrigated with warm water to remove the remaining wax. POST- PROCEDURE EXAM: TMs successfully visualized and found to have no erythema.  Pain improved. Hearing improved.   Right ear canal also with excessive cerumen  but this ear has no symptoms so no procedure necessary    Assessment & Plan:    See Problem List for Assessment and Plan of chronic medical problems.    This visit occurred during the SARS-CoV-2 public health emergency.  Safety protocols were in place, including screening questions prior to the visit, additional usage of staff PPE, and extensive cleaning of exam room while observing appropriate contact time as indicated for disinfecting solutions.

## 2020-04-19 ENCOUNTER — Encounter: Payer: Self-pay | Admitting: Internal Medicine

## 2020-04-19 ENCOUNTER — Other Ambulatory Visit: Payer: Self-pay

## 2020-04-19 ENCOUNTER — Ambulatory Visit: Payer: 59 | Admitting: Internal Medicine

## 2020-04-19 DIAGNOSIS — H9202 Otalgia, left ear: Secondary | ICD-10-CM | POA: Diagnosis not present

## 2020-04-19 NOTE — Assessment & Plan Note (Signed)
Acute Associated with hearing loss, clogged feeling Related obstructed ear canal due to excessive cerumen Ear cleaned out and symptoms improved

## 2020-04-19 NOTE — Patient Instructions (Signed)
Your left ear was successfully cleaned out today.

## 2020-04-19 NOTE — Progress Notes (Signed)
Patient consent obtained. Irrigation with water and peroxide performed on left ear. Full view of left tympanic membranes after procedure.  Patient tolerated procedure well.

## 2020-05-01 ENCOUNTER — Encounter: Payer: Self-pay | Admitting: Internal Medicine

## 2020-05-04 ENCOUNTER — Encounter: Payer: Self-pay | Admitting: Internal Medicine

## 2020-05-09 ENCOUNTER — Encounter: Payer: Self-pay | Admitting: Internal Medicine

## 2020-06-08 ENCOUNTER — Encounter: Payer: Self-pay | Admitting: Internal Medicine

## 2020-06-14 ENCOUNTER — Encounter: Payer: Self-pay | Admitting: Internal Medicine

## 2020-06-23 ENCOUNTER — Encounter: Payer: Self-pay | Admitting: Internal Medicine

## 2020-07-04 ENCOUNTER — Encounter: Payer: Self-pay | Admitting: Internal Medicine

## 2020-08-03 ENCOUNTER — Encounter: Payer: Self-pay | Admitting: Internal Medicine

## 2020-08-04 ENCOUNTER — Ambulatory Visit: Payer: 59 | Admitting: Internal Medicine

## 2020-08-18 NOTE — Telephone Encounter (Signed)
Pls advise concerning eye/neck pain. Sent him a msg concerning flu shots.Marland KitchenJohny Chess

## 2020-08-21 ENCOUNTER — Encounter: Payer: Self-pay | Admitting: Internal Medicine

## 2020-08-29 ENCOUNTER — Encounter: Payer: Self-pay | Admitting: Internal Medicine

## 2020-08-31 ENCOUNTER — Encounter: Payer: Self-pay | Admitting: Internal Medicine

## 2020-09-05 ENCOUNTER — Encounter: Payer: 59 | Admitting: Internal Medicine

## 2020-09-12 ENCOUNTER — Encounter: Payer: Self-pay | Admitting: Internal Medicine

## 2020-09-19 NOTE — Patient Instructions (Addendum)
Blood work was ordered.    All other Health Maintenance issues reviewed.   All recommended immunizations and age-appropriate screenings are up-to-date or discussed.  No immunization administered today.   Medications reviewed and updated.  Changes include :  none   An EKG was done today.   An Ultrasound of your aorta and heart were ordered.    Please followup in 6 months    Health Maintenance, Male Adopting a healthy lifestyle and getting preventive care are important in promoting health and wellness. Ask your health care provider about:  The right schedule for you to have regular tests and exams.  Things you can do on your own to prevent diseases and keep yourself healthy. What should I know about diet, weight, and exercise? Eat a healthy diet   Eat a diet that includes plenty of vegetables, fruits, low-fat dairy products, and lean protein.  Do not eat a lot of foods that are high in solid fats, added sugars, or sodium. Maintain a healthy weight Body mass index (BMI) is a measurement that can be used to identify possible weight problems. It estimates body fat based on height and weight. Your health care provider can help determine your BMI and help you achieve or maintain a healthy weight. Get regular exercise Get regular exercise. This is one of the most important things you can do for your health. Most adults should:  Exercise for at least 150 minutes each week. The exercise should increase your heart rate and make you sweat (moderate-intensity exercise).  Do strengthening exercises at least twice a week. This is in addition to the moderate-intensity exercise.  Spend less time sitting. Even light physical activity can be beneficial. Watch cholesterol and blood lipids Have your blood tested for lipids and cholesterol at 61 years of age, then have this test every 5 years. You may need to have your cholesterol levels checked more often if:  Your lipid or cholesterol  levels are high.  You are older than 61 years of age.  You are at high risk for heart disease. What should I know about cancer screening? Many types of cancers can be detected early and may often be prevented. Depending on your health history and family history, you may need to have cancer screening at various ages. This may include screening for:  Colorectal cancer.  Prostate cancer.  Skin cancer.  Lung cancer. What should I know about heart disease, diabetes, and high blood pressure? Blood pressure and heart disease  High blood pressure causes heart disease and increases the risk of stroke. This is more likely to develop in people who have high blood pressure readings, are of African descent, or are overweight.  Talk with your health care provider about your target blood pressure readings.  Have your blood pressure checked: ? Every 3-5 years if you are 37-73 years of age. ? Every year if you are 44 years old or older.  If you are between the ages of 70 and 56 and are a current or former smoker, ask your health care provider if you should have a one-time screening for abdominal aortic aneurysm (AAA). Diabetes Have regular diabetes screenings. This checks your fasting blood sugar level. Have the screening done:  Once every three years after age 64 if you are at a normal weight and have a low risk for diabetes.  More often and at a younger age if you are overweight or have a high risk for diabetes. What should I know about preventing  infection? Hepatitis B If you have a higher risk for hepatitis B, you should be screened for this virus. Talk with your health care provider to find out if you are at risk for hepatitis B infection. Hepatitis C Blood testing is recommended for:  Everyone born from [birth year] through 1967.  Anyone with known risk factors for hepatitis C. Sexually transmitted infections (STIs)  You should be screened each year for STIs, including gonorrhea and  chlamydia, if: ? You are sexually active and are younger than 61 years of age. ? You are older than 61 years of age and your health care provider tells you that you are at risk for this type of infection. ? Your sexual activity has changed since you were last screened, and you are at increased risk for chlamydia or gonorrhea. Ask your health care provider if you are at risk.  Ask your health care provider about whether you are at high risk for HIV. Your health care provider may recommend a prescription medicine to help prevent HIV infection. If you choose to take medicine to prevent HIV, you should first get tested for HIV. You should then be tested every 3 months for as long as you are taking the medicine. Follow these instructions at home: Lifestyle  Do not use any products that contain nicotine or tobacco, such as cigarettes, e-cigarettes, and chewing tobacco. If you need help quitting, ask your health care provider.  Do not use street drugs.  Do not share needles.  Ask your health care provider for help if you need support or information about quitting drugs. Alcohol use  Do not drink alcohol if your health care provider tells you not to drink.  If you drink alcohol: ? Limit how much you have to 0-2 drinks a day. ? Be aware of how much alcohol is in your drink. In the [place] one drink equals one 12 oz bottle of beer (355 mL), one 5 oz glass of wine (148 mL), or one 1 oz glass of hard liquor (44 mL). General instructions  Schedule regular health, dental, and eye exams.  Stay current with your vaccines.  Tell your health care provider if: ? You often feel depressed. ? You have ever been abused or do not feel safe at home. Summary  Adopting a healthy lifestyle and getting preventive care are important in promoting health and wellness.  Follow your health care provider's instructions about healthy diet, exercising, and getting tested or screened for diseases.  Follow your health  care provider's instructions on monitoring your cholesterol and blood pressure. This information is not intended to replace advice given to you by your health care provider. Make sure you discuss any questions you have with your health care provider. Document Revised: 10/21/2018 Document Reviewed: 10/21/2018 Elsevier Patient Education  2020 [employer].

## 2020-09-19 NOTE — Progress Notes (Signed)
Subjective:    Patient ID: Jeremy Ellis, male    DOB: 1961/04/20, 59 y.o.   MRN: [id number]  HPI He is here for a physical exam.   He does feel bloated and has increased gas.  Not sure if this is related to specific foods or not.  He has occasional GERD.  He is very active and still struggles with trying to weight gain.      Medications and allergies reviewed with patient and updated if appropriate.  Patient Active Problem List   Diagnosis Date Noted  . Left ear pain 04/19/2020  . Weight loss 03/24/2020  . Grief 02/04/2020  . Vertigo 12/22/2018  . Sinus pressure 09/01/2017  . Rosacea 05/15/2017  . Allergic rhinitis 05/15/2017  . Hyperglycemia 11/06/2016  . Raynaud's phenomenon 06/01/2015  . Personal history of colonic polyps 03/14/2014  . ILEITIS 01/24/2011  . GERD 09/15/2009  . Hyperlipidemia 03/21/2009  . BPH (benign prostatic hyperplasia) 03/18/2008  . HERNIATED DISC 12/11/2006    Current Outpatient Medications on File Prior to Visit  Medication Sig Dispense Refill  . Acetaminophen 500 MG coapsule     . bifidobacterium infantis (ALIGN) capsule Every other day    . Cholecalciferol (VITAMIN D3) 400 UNITS CAPS Take by mouth daily.    Marland Kitchen ECHINACEA PO Take by mouth 2 (two) times daily as needed (immune support). Reported on 11/02/2015    . Multiple Vitamin (MULTIVITAMIN) tablet Take 1 tablet by mouth daily.      . OMEGA-3 FATTY ACIDS-VITAMIN E PO     . Saw Palmetto 450 MG CAPS Take by mouth. 1 by mouth two times daily    . vitamin C (ASCORBIC ACID) 500 MG tablet Take 500 mg by mouth daily.    Marland Kitchen augmented betamethasone dipropionate (DIPROLENE-AF) 0.05 % cream APPLY TO AFFECTED AREAS TWICE A DAY FOR 1 3 WEEKS FOR INFLAMMATION (Patient not taking: Reported on 09/20/2020)    . hydrocortisone (ANUSOL-HC) 2.5 % rectal cream Place 1 application rectally 2 (two) times daily. (Patient not taking: Reported on 09/20/2020) 30 g 5  . hydrocortisone (ANUSOL-HC) 25 MG  suppository Place 1 suppository (25 mg total) rectally 2 (two) times daily. (Patient not taking: Reported on 09/20/2020) 12 suppository 5   No current facility-administered medications on file prior to visit.    Past Medical History:  Diagnosis Date  . Allergy   . Anxiety    situational in 2020-  . Arthritis   . GERD (gastroesophageal reflux disease)   . Hyperlipidemia    fam hx- no meds     Past Surgical History:  Procedure Laterality Date  . COLONOSCOPY  2002    Ileitis on Biospy, Dr.Perry  . colonoscopy with polypectomy  12/20/11   sessile serrated polyp  . POLYPECTOMY      Social History   Socioeconomic History  . Marital status: Single    Spouse name: Not on file  . Number of children: Not on file  . Years of education: Not on file  . Highest education level: Not on file  Occupational History  . Occupation: Personal assistant  Tobacco Use  . Smoking status: Never Smoker  . Smokeless tobacco: Never Used  Substance and Sexual Activity  . Alcohol use: Yes    Alcohol/week: 1.0 standard drink    Types: 1 Glasses of wine per week    Comment: Red wine 3-4x monthly    . Drug use: No  . Sexual activity: Not on file  Other Topics Concern  .  Not on file  Social History Narrative   Exercise - 5 days a week - cardio for 30-40 minutes   Social Determinants of Health   Financial Resource Strain:   . Difficulty of Paying Living Expenses: Not on file  Food Insecurity:   . Worried About Charity fundraiser in the Last Year: Not on file  . Ran Out of Food in the Last Year: Not on file  Transportation Needs:   . Lack of Transportation (Medical): Not on file  . Lack of Transportation (Non-Medical): Not on file  Physical Activity:   . Days of Exercise per Week: Not on file  . Minutes of Exercise per Session: Not on file  Stress:   . Feeling of Stress : Not on file  Social Connections:   . Frequency of Communication with Friends and Family: Not on file  . Frequency of Social  Gatherings with Friends and Family: Not on file  . Attends Religious Services: Not on file  . Active Member of Clubs or Organizations: Not on file  . Attends Archivist Meetings: Not on file  . Marital Status: Not on file    Family History  Problem Relation Age of Onset  . Colon cancer Paternal Grandmother 80  . Stroke Father 36       hemorrhagic  . Heart failure Father   . Heart attack Father 78  . Diabetes Father   . Hyperlipidemia Mother   . Diabetes Mother   . Other Mother        giant cell arteritis  . Heart attack Paternal Grandfather 31  . Heart attack Maternal Grandfather        in 41s  . Diabetes Maternal Grandmother   . Alzheimer's disease Maternal Grandmother   . Heart failure Daughter   . Stomach cancer Neg Hx   . Asthma Neg Hx   . COPD Neg Hx   . Colon polyps Neg Hx   . Esophageal cancer Neg Hx   . Rectal cancer Neg Hx     Review of Systems  Constitutional: Positive for chills (occasional). Negative for fever.  Eyes: Negative for visual disturbance.  Respiratory: Negative for cough, shortness of breath and wheezing.   Cardiovascular: Positive for palpitations (occ, transient). Negative for chest pain and leg swelling.  Gastrointestinal: Negative for abdominal pain, blood in stool, constipation, diarrhea and nausea.       Occ GERD, increased gas, bloating  Genitourinary: Positive for frequency and urgency. Negative for difficulty urinating, dysuria and hematuria.  Musculoskeletal: Negative for arthralgias and back pain.  Skin: Negative for rash.  Neurological: Positive for dizziness (quick movements), light-headedness (postural - occ) and headaches.  Psychiatric/Behavioral: Positive for dysphoric mood (occ). Negative for sleep disturbance. The patient is nervous/anxious (occ).        Objective:   Vitals:   09/20/20 0805  BP: 104/70  Pulse: 73  Temp: 97.9 F (36.6 C)  SpO2: 96%   Filed Weights   09/20/20 0805  Weight: 144 lb (65.3 kg)     Body mass index is 20.08 kg/m.  BP Readings from Last 3 Encounters:  09/20/20 104/70  04/19/20 112/70  03/24/20 102/70    Wt Readings from Last 3 Encounters:  09/20/20 144 lb (65.3 kg)  04/19/20 138 lb 12.8 oz (63 kg)  03/24/20 140 lb 3.2 oz (63.6 kg)   Depression screen PHQ 2/9 09/20/2020  Decreased Interest 0  Down, Depressed, Hopeless 0  PHQ - 2 Score 0  Altered sleeping 0  Tired, decreased energy 1  Change in appetite 0  Feeling bad or failure about yourself  0  Trouble concentrating 1  Moving slowly or fidgety/restless 0  Suicidal thoughts 0  PHQ-9 Score 2  Difficult doing work/chores Not difficult at all      Physical Exam Constitutional: He appears well-developed and well-nourished. No distress.  HENT:  Head: Normocephalic and atraumatic.  Right Ear: External ear normal.  Left Ear: External ear normal.  Mouth/Throat: Oropharynx is clear and moist.  Normal ear canals and TM b/l  Eyes: Conjunctivae and EOM are normal.  Neck: Neck supple. No tracheal deviation present. No thyromegaly present.  No carotid bruit  Cardiovascular: Normal rate, regular rhythm, normal heart sounds and intact distal pulses.   No murmur heard. Pulmonary/Chest: Effort normal and breath sounds normal. No respiratory distress. He has no wheezes. He has no rales.  Abdominal: Soft. He exhibits no distension. There is no tenderness.  Genitourinary: deferred  Musculoskeletal: He exhibits no edema.  Lymphadenopathy:   He has no cervical adenopathy.  Skin: Skin is warm and dry. He is not diaphoretic.  Psychiatric: He has a normal mood and affect. His behavior is normal.         Assessment & Plan:   Physical exam: Screening blood work  ordered Immunizations  Up to date  Colonoscopy   Up to date  Eye exams   Up to date  - [facility] Exercise   Regular - back at gym - doing cardio, weights Weight  normal Substance abuse   none   Screened for depression using the PHQ 2 scale.   No evidence of depression.    See Problem List for Assessment and Plan of chronic medical problems.   This visit occurred during the SARS-CoV-2 public health emergency.  Safety protocols were in place, including screening questions prior to the visit, additional usage of staff PPE, and extensive cleaning of exam room while observing appropriate contact time as indicated for disinfecting solutions.

## 2020-09-20 ENCOUNTER — Other Ambulatory Visit (INDEPENDENT_AMBULATORY_CARE_PROVIDER_SITE_OTHER): Payer: 59

## 2020-09-20 ENCOUNTER — Encounter: Payer: Self-pay | Admitting: Internal Medicine

## 2020-09-20 ENCOUNTER — Ambulatory Visit (INDEPENDENT_AMBULATORY_CARE_PROVIDER_SITE_OTHER): Payer: 59 | Admitting: Internal Medicine

## 2020-09-20 ENCOUNTER — Other Ambulatory Visit: Payer: Self-pay

## 2020-09-20 VITALS — BP 104/70 | HR 73 | Temp 97.9°F | Ht 71.0 in | Wt 144.0 lb

## 2020-09-20 DIAGNOSIS — Z0000 Encounter for general adult medical examination without abnormal findings: Secondary | ICD-10-CM

## 2020-09-20 DIAGNOSIS — R002 Palpitations: Secondary | ICD-10-CM | POA: Diagnosis not present

## 2020-09-20 DIAGNOSIS — R739 Hyperglycemia, unspecified: Secondary | ICD-10-CM

## 2020-09-20 DIAGNOSIS — E782 Mixed hyperlipidemia: Secondary | ICD-10-CM | POA: Diagnosis not present

## 2020-09-20 DIAGNOSIS — Z136 Encounter for screening for cardiovascular disorders: Secondary | ICD-10-CM

## 2020-09-20 DIAGNOSIS — K219 Gastro-esophageal reflux disease without esophagitis: Secondary | ICD-10-CM

## 2020-09-20 DIAGNOSIS — N400 Enlarged prostate without lower urinary tract symptoms: Secondary | ICD-10-CM

## 2020-09-20 NOTE — Assessment & Plan Note (Signed)
Chronic Sees urology - psa by them

## 2020-09-20 NOTE — Assessment & Plan Note (Signed)
Acute Will order [place] to r/o AAA fam his of aneurysm/dissection

## 2020-09-20 NOTE — Assessment & Plan Note (Signed)
Chronic Check a1c Low sugar / carb diet continue regular exercise

## 2020-09-20 NOTE — Assessment & Plan Note (Signed)
Subacute occaisonal and likely benign Cbc, cmp, tsh EKG today - NSR at 60 bpm, normal EKG - no change from prio Will order Echo

## 2020-09-20 NOTE — Assessment & Plan Note (Signed)
Chronic Occasional Takes pepcid prn only, continue

## 2020-09-20 NOTE — Assessment & Plan Note (Signed)
Chronic Check lipid panel, cmp, tsh  lifestyle controlled Continue Regular exercise and healthy diet

## 2020-09-21 ENCOUNTER — Encounter: Payer: Self-pay | Admitting: Internal Medicine

## 2020-09-21 LAB — CBC WITH DIFFERENTIAL/PLATELET
Basophils Absolute: 0 10*3/uL (ref 0.0–0.2)
Basos: 1 %
EOS (ABSOLUTE): 0.4 10*3/uL (ref 0.0–0.4)
Eos: 8 %
Hematocrit: 41.2 % (ref 37.5–51.0)
Hemoglobin: 14.3 g/dL (ref 13.0–17.7)
Immature Grans (Abs): 0 10*3/uL (ref 0.0–0.1)
Immature Granulocytes: 0 %
Lymphocytes Absolute: 0.9 10*3/uL (ref 0.7–3.1)
Lymphs: 20 %
MCH: 34 pg — ABNORMAL HIGH (ref 26.6–33.0)
MCHC: 34.7 g/dL (ref 31.5–35.7)
MCV: 98 fL — ABNORMAL HIGH (ref 79–97)
Monocytes Absolute: 0.5 10*3/uL (ref 0.1–0.9)
Monocytes: 11 %
Neutrophils Absolute: 2.7 10*3/uL (ref 1.4–7.0)
Neutrophils: 60 %
Platelets: 156 10*3/uL (ref 150–450)
RBC: 4.2 x10E6/uL (ref 4.14–5.80)
RDW: 11.6 % (ref 11.6–15.4)
WBC: 4.5 10*3/uL (ref 3.4–10.8)

## 2020-09-21 LAB — COMPREHENSIVE METABOLIC PANEL
ALT: 17 IU/L (ref 0–44)
AST: 24 IU/L (ref 0–40)
Albumin/Globulin Ratio: 2 (ref 1.2–2.2)
Albumin: 4.4 g/dL (ref 3.8–4.8)
Alkaline Phosphatase: 65 IU/L (ref 44–121)
BUN/Creatinine Ratio: 16 (ref 10–24)
BUN: 18 mg/dL (ref 8–27)
Bilirubin Total: 0.5 mg/dL (ref 0.0–1.2)
CO2: 27 mmol/L (ref 20–29)
Calcium: 9.3 mg/dL (ref 8.6–10.2)
Chloride: 104 mmol/L (ref 96–106)
Creatinine, Ser: 1.1 mg/dL (ref 0.76–1.27)
GFR calc Af Amer: 83 mL/min/{1.73_m2} (ref >59)
GFR calc non Af Amer: 72 mL/min/{1.73_m2} (ref >59)
Globulin, Total: 2.2 g/dL (ref 1.5–4.5)
Glucose: 100 mg/dL — ABNORMAL HIGH (ref 65–99)
Potassium: 4.7 mmol/L (ref 3.5–5.2)
Sodium: 143 mmol/L (ref 134–144)
Total Protein: 6.6 g/dL (ref 6.0–8.5)

## 2020-09-21 LAB — LIPID PANEL
Chol/HDL Ratio: 2.6 ratio (ref 0.0–5.0)
Cholesterol, Total: 143 mg/dL (ref 100–199)
HDL: 55 mg/dL (ref >39)
LDL Chol Calc (NIH): 78 mg/dL (ref 0–99)
Triglycerides: 45 mg/dL (ref 0–149)
VLDL Cholesterol Cal: 10 mg/dL (ref 5–40)

## 2020-09-21 LAB — HEMOGLOBIN A1C
Est. average glucose Bld gHb Est-mCnc: 117 mg/dL
Hgb A1c MFr Bld: 5.7 % — ABNORMAL HIGH (ref 4.8–5.6)

## 2020-09-21 LAB — TSH: TSH: 2.38 u[IU]/mL (ref 0.450–4.500)

## 2020-09-21 NOTE — Telephone Encounter (Signed)
Please advise 

## 2020-09-26 ENCOUNTER — Encounter: Payer: Self-pay | Admitting: Internal Medicine

## 2020-09-26 ENCOUNTER — Ambulatory Visit: Payer: 59 | Attending: Internal Medicine

## 2020-09-26 ENCOUNTER — Other Ambulatory Visit: Payer: Self-pay | Admitting: Internal Medicine

## 2020-09-26 DIAGNOSIS — Z23 Encounter for immunization: Secondary | ICD-10-CM

## 2020-09-26 NOTE — Progress Notes (Signed)
   Covid-19 Vaccination Clinic  Name:  Jeremy Ellis    MRN: [id number] DOB: [birth year]  09/26/2020  [name] was observed post Covid-19 immunization for 15 minutes without incident. He was provided with Vaccine Information Sheet and instruction to access the V-Safe system.   [name] was instructed to call 911 with any severe reactions post vaccine: Marland Kitchen Difficulty breathing  . Swelling of face and throat  . A fast heartbeat  . A bad rash all over body  . Dizziness and weakness   Immunizations Administered    Name Date Dose VIS Date Route   Pfizer COVID-19 Vaccine 09/26/2020  2:10 PM 0.3 mL 08/30/2020 Intramuscular   Manufacturer: [place]   Lot: [id number]   [place]: [address]-8

## 2020-09-27 ENCOUNTER — Encounter: Payer: Self-pay | Admitting: Internal Medicine

## 2020-10-12 ENCOUNTER — Inpatient Hospital Stay (HOSPITAL_COMMUNITY): Admission: RE | Admit: 2020-10-12 | Payer: 59 | Source: Ambulatory Visit

## 2020-10-16 ENCOUNTER — Other Ambulatory Visit (HOSPITAL_COMMUNITY): Payer: 59

## 2020-10-19 ENCOUNTER — Encounter: Payer: Self-pay | Admitting: Internal Medicine

## 2020-10-25 ENCOUNTER — Encounter: Payer: Self-pay | Admitting: Internal Medicine

## 2020-11-21 ENCOUNTER — Ambulatory Visit (HOSPITAL_COMMUNITY): Payer: Managed Care, Other (non HMO)

## 2020-11-27 ENCOUNTER — Telehealth (HOSPITAL_COMMUNITY): Payer: Self-pay | Admitting: Internal Medicine

## 2020-11-27 NOTE — Telephone Encounter (Signed)
Patient called and cancelled echocardiogram x2 due to conflicting appointments and will call at a later date to reschedule.  Order will be removed from the [facility] and if patient calls back we can reinstate the order.

## 2020-11-28 ENCOUNTER — Other Ambulatory Visit (HOSPITAL_COMMUNITY): Payer: 59

## 2020-11-30 ENCOUNTER — Encounter: Payer: Self-pay | Admitting: Internal Medicine

## 2020-11-30 ENCOUNTER — Ambulatory Visit (HOSPITAL_COMMUNITY)
Admission: RE | Admit: 2020-11-30 | Payer: Managed Care, Other (non HMO) | Source: Ambulatory Visit | Attending: Internal Medicine | Admitting: Internal Medicine

## 2020-12-04 ENCOUNTER — Encounter: Payer: Self-pay | Admitting: Internal Medicine

## 2020-12-06 ENCOUNTER — Encounter: Payer: Self-pay | Admitting: Internal Medicine

## 2020-12-11 ENCOUNTER — Encounter: Payer: Self-pay | Admitting: Internal Medicine

## 2020-12-28 ENCOUNTER — Inpatient Hospital Stay (HOSPITAL_COMMUNITY): Admission: RE | Admit: 2020-12-28 | Payer: Managed Care, Other (non HMO) | Source: Ambulatory Visit

## 2021-01-03 ENCOUNTER — Encounter: Payer: Self-pay | Admitting: Internal Medicine

## 2021-01-30 ENCOUNTER — Encounter: Payer: Self-pay | Admitting: Internal Medicine

## 2021-02-01 NOTE — Progress Notes (Signed)
Subjective:    Patient ID: Jeremy Ellis, male    DOB: 06/20/1961, 60 y.o.   MRN: [id number]  HPI The patient is here for an acute visit for acid taste in mouth, occasional burning in throat and stomach, band across his chest at times.  He has an occasional mild headache that improves with tylenol. .  These symptoms are not new, but increased over the past two weeks.    He does yoga when he can.  He is exercising regularly, but not as much as he would like to because of how busy milligrams at the office and outside of the office.   One of his parents who was exercising regularly and doing everything you should do just died of a heart attack and that is concerning.  The stress level is high, but that is not something new.   Medications and allergies reviewed with patient and updated if appropriate.  Patient Active Problem List   Diagnosis Date Noted  . Screening for AAA (abdominal aortic aneurysm) 09/20/2020  . Palpitations 09/20/2020  . Grief 02/04/2020  . Vertigo 12/22/2018  . Rosacea 05/15/2017  . Allergic rhinitis 05/15/2017  . Hyperglycemia 11/06/2016  . Raynaud's phenomenon 06/01/2015  . Personal history of colonic polyps 03/14/2014  . ILEITIS 01/24/2011  . GERD 09/15/2009  . Hyperlipidemia 03/21/2009  . BPH (benign prostatic hyperplasia) 03/18/2008  . HERNIATED DISC 12/11/2006    Current Outpatient Medications on File Prior to Visit  Medication Sig Dispense Refill  . Acetaminophen 500 MG coapsule     . bifidobacterium infantis (ALIGN) capsule Every other day    . Cholecalciferol (VITAMIN D3) 400 UNITS CAPS Take by mouth daily.    Marland Kitchen ECHINACEA PO Take by mouth 2 (two) times daily as needed (immune support). Reported on 11/02/2015    . Multiple Vitamin (MULTIVITAMIN) tablet Take 1 tablet by mouth daily.    . OMEGA-3 FATTY ACIDS-VITAMIN E PO     . Saw Palmetto 450 MG CAPS Take by mouth. 1 by mouth two times daily    . vitamin C (ASCORBIC ACID) 500 MG tablet  Take 500 mg by mouth daily.     No current facility-administered medications on file prior to visit.    Past Medical History:  Diagnosis Date  . Allergy   . Anxiety    situational in 2020-  . Arthritis   . GERD (gastroesophageal reflux disease)   . Hyperlipidemia    fam hx- no meds     Past Surgical History:  Procedure Laterality Date  . COLONOSCOPY  2002    Ileitis on Biospy, Dr.Perry  . colonoscopy with polypectomy  12/20/11   sessile serrated polyp  . POLYPECTOMY      Social History   Socioeconomic History  . Marital status: Single    Spouse name: Not on file  . Number of children: Not on file  . Years of education: Not on file  . Highest education level: Not on file  Occupational History  . Occupation: Personal assistant  Tobacco Use  . Smoking status: Never Smoker  . Smokeless tobacco: Never Used  Substance and Sexual Activity  . Alcohol use: Yes    Alcohol/week: 1.0 standard drink    Types: 1 Glasses of wine per week    Comment: Red wine 3-4x monthly    . Drug use: No  . Sexual activity: Not on file  Other Topics Concern  . Not on file  Social History Narrative   Exercise -  5 days a week - cardio for 30-40 minutes   Social Determinants of Health   Financial Resource Strain: Not on file  Food Insecurity: Not on file  Transportation Needs: Not on file  Physical Activity: Not on file  Stress: Not on file  Social Connections: Not on file    Family History  Problem Relation Age of Onset  . Colon cancer Paternal Grandmother 18  . Stroke Father 23       hemorrhagic  . Heart failure Father   . Heart attack Father 70  . Diabetes Father   . Hyperlipidemia Mother   . Diabetes Mother   . Other Mother        giant cell arteritis  . Heart attack Paternal Grandfather 60  . Heart attack Maternal Grandfather        in 62s  . Diabetes Maternal Grandmother   . Alzheimer's disease Maternal Grandmother   . Heart failure Daughter   . Stomach cancer Neg Hx   .  Asthma Neg Hx   . COPD Neg Hx   . Colon polyps Neg Hx   . Esophageal cancer Neg Hx   . Rectal cancer Neg Hx     Review of Systems  Constitutional: Negative for fever.  Respiratory: Negative for shortness of breath.   Cardiovascular: Positive for chest pain (band across chest at times). Negative for palpitations (sensation of heart beating at times).  Gastrointestinal: Positive for abdominal distention. Negative for abdominal pain (burning sensation at times), constipation and diarrhea.  Neurological: Positive for light-headedness (occ standing up) and headaches (occasionally).  Psychiatric/Behavioral: Negative for sleep disturbance.       Objective:   Vitals:   02/02/21 0853  BP: 108/74  Pulse: 61  Temp: 98.2 F (36.8 C)  SpO2: 98%   BP Readings from Last 3 Encounters:  02/02/21 108/74  09/20/20 104/70  04/19/20 112/70   Wt Readings from Last 3 Encounters:  02/02/21 143 lb (64.9 kg)  09/20/20 144 lb (65.3 kg)  04/19/20 138 lb 12.8 oz (63 kg)   Body mass index is 19.94 kg/m.   Physical Exam Constitutional:      General: He is not in acute distress.    Appearance: Normal appearance. He is not ill-appearing.  HENT:     Head: Normocephalic and atraumatic.  Cardiovascular:     Rate and Rhythm: Normal rate and regular rhythm.     Heart sounds: No murmur heard.   Pulmonary:     Effort: Pulmonary effort is normal. No respiratory distress.     Breath sounds: No wheezing or rales.  Abdominal:     General: There is no distension.     Palpations: Abdomen is soft.     Tenderness: There is no abdominal tenderness. There is no guarding or rebound.  Musculoskeletal:     Right lower leg: No edema.     Left lower leg: No edema.  Skin:    General: Skin is warm and dry.  Neurological:     Mental Status: He is alert.            Assessment & Plan:    See Problem List for Assessment and Plan of chronic medical problems.    This visit occurred during the  SARS-CoV-2 public health emergency.  Safety protocols were in place, including screening questions prior to the visit, additional usage of staff PPE, and extensive cleaning of exam room while observing appropriate contact time as indicated for disinfecting solutions.

## 2021-02-02 ENCOUNTER — Other Ambulatory Visit: Payer: Self-pay

## 2021-02-02 ENCOUNTER — Encounter: Payer: Self-pay | Admitting: Internal Medicine

## 2021-02-02 ENCOUNTER — Ambulatory Visit: Payer: Managed Care, Other (non HMO) | Admitting: Internal Medicine

## 2021-02-02 VITALS — BP 108/74 | HR 61 | Temp 98.2°F | Ht 71.0 in | Wt 143.0 lb

## 2021-02-02 DIAGNOSIS — K219 Gastro-esophageal reflux disease without esophagitis: Secondary | ICD-10-CM

## 2021-02-02 DIAGNOSIS — R0789 Other chest pain: Secondary | ICD-10-CM

## 2021-02-02 MED ORDER — FAMOTIDINE 40 MG PO TABS: 40.0000 mg | ORAL_TABLET | Freq: Every day | ORAL | 5 refills | Status: DC | PRN

## 2021-02-02 NOTE — Assessment & Plan Note (Signed)
Acute, but he has had this in the past Bandlike feeling across chest Likely gastrointestinal in nature more stress related Discussed possibility of a cardiac cause, which I think is osteology We will go ahead and treat GERD Discussed stress management-he deferred medication We will order CT cardiac calcium score to evaluate risk

## 2021-02-02 NOTE — Patient Instructions (Addendum)
A Ct scan was ordered to look at the calcium blockage in your heart arteries.   Take the pepcid ( famotidine) daily for 2 weeks then just as needed.  Take it for a week if needed to get your symptoms controlled.

## 2021-02-02 NOTE — Assessment & Plan Note (Signed)
Acute on chronic I believe his symptoms of burning chest and stomach, sore throat, band across her chest is likely gastrointestinal Start Pepcid 40 mg daily for 2 weeks and then take as needed Discussed foods that typically cause GERD as well stress

## 2021-02-19 ENCOUNTER — Encounter: Payer: Self-pay | Admitting: Internal Medicine

## 2021-02-26 ENCOUNTER — Encounter: Payer: Self-pay | Admitting: Internal Medicine

## 2021-02-27 ENCOUNTER — Encounter: Payer: Self-pay | Admitting: Internal Medicine

## 2021-03-05 ENCOUNTER — Other Ambulatory Visit: Payer: Self-pay

## 2021-03-05 ENCOUNTER — Encounter: Payer: Self-pay | Admitting: Internal Medicine

## 2021-03-05 ENCOUNTER — Ambulatory Visit (INDEPENDENT_AMBULATORY_CARE_PROVIDER_SITE_OTHER)
Admission: RE | Admit: 2021-03-05 | Discharge: 2021-03-05 | Disposition: A | Discharge status: Home / Self Care | Payer: Self-pay | Source: Ambulatory Visit | Attending: Internal Medicine | Admitting: Internal Medicine

## 2021-03-05 DIAGNOSIS — R0789 Other chest pain: Secondary | ICD-10-CM

## 2021-03-16 ENCOUNTER — Encounter: Payer: Self-pay | Admitting: Internal Medicine

## 2021-03-23 ENCOUNTER — Ambulatory Visit: Payer: Managed Care, Other (non HMO)

## 2021-03-27 DIAGNOSIS — F419 Anxiety disorder, unspecified: Secondary | ICD-10-CM | POA: Insufficient documentation

## 2021-03-27 NOTE — Patient Instructions (Addendum)
    Blood work was ordered.      Medications changes include :   none     Please followup in 6 months  

## 2021-03-27 NOTE — Progress Notes (Signed)
Subjective:    Patient ID: Jeremy Ellis, male    DOB: 1961/04/07, 60 y.o.   MRN: [id number]  HPI The patient is here for follow up of their chronic medical problems, including GERD, prediabetes   He has not been able to gain weight.  He is exercising regularly.  He eats healthy.     Medications and allergies reviewed with patient and updated if appropriate.  Patient Active Problem List   Diagnosis Date Noted  . Anxiety 03/27/2021  . Chest discomfort 02/02/2021  . Palpitations 09/20/2020  . Grief 02/04/2020  . Vertigo 12/22/2018  . Rosacea 05/15/2017  . Allergic rhinitis 05/15/2017  . Prediabetes 11/06/2016  . Raynaud's phenomenon 06/01/2015  . Personal history of colonic polyps 03/14/2014  . ILEITIS 01/24/2011  . GERD 09/15/2009  . Hyperlipidemia 03/21/2009  . BPH (benign prostatic hyperplasia) 03/18/2008  . HERNIATED DISC 12/11/2006    Current Outpatient Medications on File Prior to Visit  Medication Sig Dispense Refill  . Acetaminophen 500 MG coapsule     . bifidobacterium infantis (ALIGN) capsule Every other day    . Cholecalciferol (VITAMIN D3) 400 UNITS CAPS Take by mouth daily.    Marland Kitchen COVID-19 mRNA vaccine, Pfizer, 30 MCG/0.3ML injection USE AS DIRECTED .3 mL 0  . ECHINACEA PO Take by mouth 2 (two) times daily as needed (immune support). Reported on 11/02/2015    . famotidine (PEPCID) 40 MG tablet Take 1 tablet (40 mg total) by mouth daily as needed for heartburn or indigestion. 30 tablet 5  . Multiple Vitamin (MULTIVITAMIN) tablet Take 1 tablet by mouth daily.    . OMEGA-3 FATTY ACIDS-VITAMIN E PO     . Saw Palmetto 450 MG CAPS Take by mouth. 1 by mouth two times daily    . vitamin C (ASCORBIC ACID) 500 MG tablet Take 500 mg by mouth daily.     No current facility-administered medications on file prior to visit.    Past Medical History:  Diagnosis Date  . Allergy   . Anxiety    situational in 2020-  . Arthritis   . GERD (gastroesophageal reflux  disease)   . Hyperlipidemia    fam hx- no meds     Past Surgical History:  Procedure Laterality Date  . COLONOSCOPY  2002    Ileitis on Biospy, Dr.Perry  . colonoscopy with polypectomy  12/20/11   sessile serrated polyp  . POLYPECTOMY      Social History   Socioeconomic History  . Marital status: Single    Spouse name: Not on file  . Number of children: Not on file  . Years of education: Not on file  . Highest education level: Not on file  Occupational History  . Occupation: Personal assistant  Tobacco Use  . Smoking status: Never Smoker  . Smokeless tobacco: Never Used  Substance and Sexual Activity  . Alcohol use: Yes    Alcohol/week: 1.0 standard drink    Types: 1 Glasses of wine per week    Comment: Red wine 3-4x monthly    . Drug use: No  . Sexual activity: Not on file  Other Topics Concern  . Not on file  Social History Narrative   Exercise - 5 days a week - cardio for 30-40 minutes   Social Determinants of Health   Financial Resource Strain: Not on file  Food Insecurity: Not on file  Transportation Needs: Not on file  Physical Activity: Not on file  Stress: Not on file  Social  Connections: Not on file    Family History  Problem Relation Age of Onset  . Colon cancer Paternal Grandmother 78  . Stroke Father 1       hemorrhagic  . Heart failure Father   . Heart attack Father 59  . Diabetes Father   . Hyperlipidemia Mother   . Diabetes Mother   . Other Mother        giant cell arteritis  . Heart attack Paternal Grandfather 76  . Heart attack Maternal Grandfather        in 45s  . Diabetes Maternal Grandmother   . Alzheimer's disease Maternal Grandmother   . Heart failure Daughter   . Stomach cancer Neg Hx   . Asthma Neg Hx   . COPD Neg Hx   . Colon polyps Neg Hx   . Esophageal cancer Neg Hx   . Rectal cancer Neg Hx     Review of Systems  Constitutional: Negative for appetite change, chills and fever.  Respiratory: Negative for cough, shortness of  breath and wheezing.   Cardiovascular: Negative for chest pain, palpitations and leg swelling.  Neurological: Positive for headaches (occ).  Psychiatric/Behavioral: Negative for dysphoric mood and sleep disturbance. The patient is not nervous/anxious.        Objective:   Vitals:   03/28/21 0809  BP: 102/68  Pulse: 67  Temp: 98.2 F (36.8 C)  SpO2: 97%   BP Readings from Last 3 Encounters:  03/28/21 102/68  02/02/21 108/74  09/20/20 104/70   Wt Readings from Last 3 Encounters:  03/28/21 142 lb 9.6 oz (64.7 kg)  02/02/21 143 lb (64.9 kg)  09/20/20 144 lb (65.3 kg)   Body mass index is 19.89 kg/m.   Physical Exam    Constitutional: Appears well-developed and well-nourished. No distress.  HENT:  Head: Normocephalic and atraumatic.  Neck: Neck supple. No tracheal deviation present. No thyromegaly present.  No cervical lymphadenopathy Cardiovascular: Normal rate, regular rhythm and normal heart sounds.   No murmur heard. No carotid bruit .  No edema Pulmonary/Chest: Effort normal and breath sounds normal. No respiratory distress. No has no wheezes. No rales.  Skin: Skin is warm and dry. Not diaphoretic.  Psychiatric: Normal mood and affect. Behavior is normal.      Assessment & Plan:    See Problem List for Assessment and Plan of chronic medical problems.    This visit occurred during the SARS-CoV-2 public health emergency.  Safety protocols were in place, including screening questions prior to the visit, additional usage of staff PPE, and extensive cleaning of exam room while observing appropriate contact time as indicated for disinfecting solutions.

## 2021-03-28 ENCOUNTER — Other Ambulatory Visit: Payer: Self-pay

## 2021-03-28 ENCOUNTER — Ambulatory Visit: Payer: Managed Care, Other (non HMO) | Admitting: Internal Medicine

## 2021-03-28 ENCOUNTER — Encounter: Payer: Self-pay | Admitting: Internal Medicine

## 2021-03-28 VITALS — BP 102/68 | HR 67 | Temp 98.2°F | Ht 71.0 in | Wt 142.6 lb

## 2021-03-28 DIAGNOSIS — F419 Anxiety disorder, unspecified: Secondary | ICD-10-CM

## 2021-03-28 DIAGNOSIS — R7303 Prediabetes: Secondary | ICD-10-CM | POA: Diagnosis not present

## 2021-03-28 DIAGNOSIS — K219 Gastro-esophageal reflux disease without esophagitis: Secondary | ICD-10-CM | POA: Diagnosis not present

## 2021-03-28 LAB — BASIC METABOLIC PANEL
BUN: 24 mg/dL — ABNORMAL HIGH (ref 6–23)
CO2: 30 mEq/L (ref 19–32)
Calcium: 9.7 mg/dL (ref 8.4–10.5)
Chloride: 102 mEq/L (ref 96–112)
Creatinine, Ser: 1.03 mg/dL (ref 0.40–1.50)
GFR: 78.12 mL/min (ref >60.00)
Glucose, Bld: 95 mg/dL (ref 70–99)
Potassium: 4.4 mEq/L (ref 3.5–5.1)
Sodium: 139 mEq/L (ref 135–145)

## 2021-03-28 LAB — HEMOGLOBIN A1C: Hgb A1c MFr Bld: 6 % (ref 4.6–6.5)

## 2021-03-28 NOTE — Assessment & Plan Note (Signed)
Chronic Check a1c Continue low sugar / carb diet Continue regular exercise

## 2021-03-28 NOTE — Addendum Note (Signed)
Addended by: Boris Lown B on: 03/28/2021 08:53 AM   Modules accepted: Orders

## 2021-03-28 NOTE — Assessment & Plan Note (Addendum)
Chronic Likely related to increased stress Continue Pepcid 40 mg daily prn

## 2021-03-29 ENCOUNTER — Encounter: Payer: Self-pay | Admitting: Internal Medicine

## 2021-03-30 ENCOUNTER — Ambulatory Visit: Payer: Managed Care, Other (non HMO)

## 2021-04-19 ENCOUNTER — Encounter: Payer: Self-pay | Admitting: Internal Medicine

## 2021-04-20 ENCOUNTER — Ambulatory Visit: Payer: Managed Care, Other (non HMO)

## 2021-05-20 ENCOUNTER — Encounter: Payer: Self-pay | Admitting: Internal Medicine

## 2021-05-24 ENCOUNTER — Telehealth: Payer: Managed Care, Other (non HMO) | Admitting: Family Medicine

## 2021-05-28 ENCOUNTER — Other Ambulatory Visit: Payer: Self-pay

## 2021-05-28 ENCOUNTER — Encounter: Payer: Self-pay | Admitting: Internal Medicine

## 2021-05-28 ENCOUNTER — Ambulatory Visit (INDEPENDENT_AMBULATORY_CARE_PROVIDER_SITE_OTHER): Payer: Managed Care, Other (non HMO) | Admitting: Internal Medicine

## 2021-05-28 ENCOUNTER — Telehealth: Payer: Self-pay | Admitting: Internal Medicine

## 2021-05-28 ENCOUNTER — Telehealth: Payer: Self-pay

## 2021-05-28 ENCOUNTER — Ambulatory Visit (INDEPENDENT_AMBULATORY_CARE_PROVIDER_SITE_OTHER): Payer: Managed Care, Other (non HMO)

## 2021-05-28 VITALS — BP 110/70 | HR 60 | Temp 98.7°F | Ht 71.0 in | Wt 140.8 lb

## 2021-05-28 DIAGNOSIS — R059 Cough, unspecified: Secondary | ICD-10-CM | POA: Diagnosis not present

## 2021-05-28 DIAGNOSIS — H6120 Impacted cerumen, unspecified ear: Secondary | ICD-10-CM | POA: Insufficient documentation

## 2021-05-28 DIAGNOSIS — H6123 Impacted cerumen, bilateral: Secondary | ICD-10-CM | POA: Diagnosis not present

## 2021-05-28 DIAGNOSIS — J209 Acute bronchitis, unspecified: Secondary | ICD-10-CM | POA: Diagnosis not present

## 2021-05-28 DIAGNOSIS — R110 Nausea: Secondary | ICD-10-CM | POA: Diagnosis not present

## 2021-05-28 MED ORDER — CEFDINIR 300 MG PO CAPS: 300.0000 mg | ORAL_CAPSULE | Freq: Two times a day (BID) | ORAL | 0 refills | Status: DC

## 2021-05-28 MED ORDER — ONDANSETRON HCL 4 MG PO TABS: 4.0000 mg | ORAL_TABLET | Freq: Two times a day (BID) | ORAL | 0 refills | Status: DC | PRN

## 2021-05-28 NOTE — Assessment & Plan Note (Signed)
Nausea due to multiple antibiotics. Discussed He has a history of multiple antibiotic intolerance.  She stopped doxycycline after 2 days.  Will use Omnicef.  He is aware of the risk of cross reaction with penicillins.  He will use Zofran if he gets nauseated.

## 2021-05-28 NOTE — Assessment & Plan Note (Signed)
R>L The patient will try to use a wax removing kit at home.  Come here if not successful

## 2021-05-28 NOTE — Telephone Encounter (Signed)
It has been addressed in the results.  Thank you

## 2021-05-28 NOTE — Progress Notes (Signed)
Subjective:  Patient ID: Jeremy Ellis, male    DOB: 10-07-1961  Age: 60 y.o. MRN: [id number]  CC: Cough   HPI [place] presents for cough x 1.5 weeks.   He  got sick with a productive cough 1 week week before Friday, she was very tired.  On Friday he was feeling worse and went to urgent care.  He was prescribed doxycycline and took it for 2 days.  Had to stop it because of the nausea.  In fact he gets nauseated with oral antibiotics.  He was feeling better however.  He tells me that he is allergic to penicillin, erythromycin and sulfa.  Is complaining of bilateral ear congestion  Outpatient Medications Prior to Visit  Medication Sig Dispense Refill   Acetaminophen 500 MG coapsule      bifidobacterium infantis (ALIGN) capsule Every other day     Cholecalciferol (VITAMIN D3) 400 UNITS CAPS Take by mouth daily.     COVID-19 mRNA vaccine, Pfizer, 30 MCG/0.3ML injection USE AS DIRECTED .3 mL 0   ECHINACEA PO Take by mouth 2 (two) times daily as needed (immune support). Reported on 11/02/2015     famotidine (PEPCID) 40 MG tablet Take 1 tablet (40 mg total) by mouth daily as needed for heartburn or indigestion. 30 tablet 5   Multiple Vitamin (MULTIVITAMIN) tablet Take 1 tablet by mouth daily.     OMEGA-3 FATTY ACIDS-VITAMIN E PO      Saw Palmetto 450 MG CAPS Take by mouth. 1 by mouth two times daily     vitamin C (ASCORBIC ACID) 500 MG tablet Take 500 mg by mouth daily.     No facility-administered medications prior to visit.    ROS: Review of Systems  Constitutional:  Positive for fatigue. Negative for appetite change and unexpected weight change.  HENT:  Positive for congestion and hearing loss. Negative for nosebleeds, sneezing, sore throat and trouble swallowing.   Eyes:  Negative for itching and visual disturbance.  Respiratory:  Positive for cough.   Cardiovascular:  Negative for chest pain, palpitations and leg swelling.  Gastrointestinal:  Positive for nausea.  Negative for abdominal distention, blood in stool and diarrhea.  Genitourinary:  Negative for frequency and hematuria.  Musculoskeletal:  Negative for back pain, gait problem, joint swelling and neck pain.  Skin:  Negative for rash.  Neurological:  Negative for dizziness, tremors, speech difficulty and weakness.  Psychiatric/Behavioral:  Negative for agitation, dysphoric mood, sleep disturbance and suicidal ideas. The patient is not nervous/anxious.    Objective:  BP 110/70 (BP Location: Left Arm)   Pulse 60   Temp 98.7 F (37.1 C) (Oral)   Ht 5\' 11"  (1.803 m)   Wt 140 lb 12.8 oz (63.9 kg)   SpO2 97%   BMI 19.64 kg/m   BP Readings from Last 3 Encounters:  05/28/21 110/70  03/28/21 102/68  02/02/21 108/74    Wt Readings from Last 3 Encounters:  05/28/21 140 lb 12.8 oz (63.9 kg)  03/28/21 142 lb 9.6 oz (64.7 kg)  02/02/21 143 lb (64.9 kg)    Physical Exam Constitutional:      General: He is not in acute distress.    Appearance: He is well-developed. He is not ill-appearing.     Comments: NAD  Eyes:     Conjunctiva/sclera: Conjunctivae normal.     Pupils: Pupils are equal, round, and reactive to light.  Neck:     Thyroid: No thyromegaly.     Vascular:  No JVD.  Cardiovascular:     Rate and Rhythm: Normal rate and regular rhythm.     Heart sounds: Normal heart sounds. No murmur heard.   No friction rub. No gallop.  Pulmonary:     Effort: Pulmonary effort is normal. No respiratory distress.     Breath sounds: Rhonchi present. No wheezing or rales.  Chest:     Chest wall: No tenderness.  Abdominal:     General: Bowel sounds are normal. There is no distension.     Palpations: Abdomen is soft. There is no mass.     Tenderness: There is no abdominal tenderness. There is no guarding or rebound.  Musculoskeletal:        General: No tenderness. Normal range of motion.     Cervical back: Normal range of motion.  Lymphadenopathy:     Cervical: No cervical adenopathy.   Skin:    General: Skin is warm and dry.     Findings: No rash.  Neurological:     Mental Status: He is alert and oriented to person, place, and time.     Cranial Nerves: No cranial nerve deficit.     Motor: No abnormal muscle tone.     Coordination: Coordination normal.     Gait: Gait normal.     Deep Tendon Reflexes: Reflexes are normal and symmetric.  Psychiatric:        Behavior: Behavior normal.        Thought Content: Thought content normal.        Judgment: Judgment normal.   Mild rhonchi bilaterally more on the left Bilateral wax impaction  Lab Results  Component Value Date   WBC 4.5 09/20/2020   HGB 14.3 09/20/2020   HCT 41.2 09/20/2020   PLT 156 09/20/2020   GLUCOSE 95 03/28/2021   CHOL 143 09/20/2020   TRIG 45 09/20/2020   HDL 55 09/20/2020   LDLCALC 78 09/20/2020   ALT 17 09/20/2020   AST 24 09/20/2020   NA 139 03/28/2021   K 4.4 03/28/2021   CL 102 03/28/2021   CREATININE 1.03 03/28/2021   BUN 24 (H) 03/28/2021   CO2 30 03/28/2021   TSH 2.380 09/20/2020   PSA 0.29 03/03/2018   INR 1.1 ratio (H) 10/31/2009   HGBA1C 6.0 03/28/2021    CT CARDIAC SCORING (SELF PAY ONLY)  Addendum Date: 03/05/2021   ADDENDUM REPORT: 03/05/2021 12:30 CLINICAL DATA:  Cardiovascular Disease Risk stratification EXAM: Coronary Calcium Score TECHNIQUE: A gated, non-contrast computed tomography scan of the heart was performed using 32mm slice thickness. Axial images were analyzed on a dedicated workstation. Calcium scoring of the coronary arteries was performed using the Agatston method. FINDINGS: Coronary Calcium Score: Left main: 0 Left anterior descending artery: 0 Left circumflex artery: 0 Right coronary artery: 0 Total: 0 Percentile: NA Pericardium: Normal. Ascending Aorta: Normal caliber. Non-cardiac: See separate report from [facility]. IMPRESSION: Coronary calcium score of 0. RECOMMENDATIONS: Coronary artery calcium (CAC) score is a strong predictor of incident coronary  heart disease (CHD) and provides predictive information beyond traditional risk factors. CAC scoring is reasonable to use in the decision to withhold, postpone, or initiate statin therapy in intermediate-risk or selected borderline-risk asymptomatic adults (age 11-75 years and LDL-C >=70 to <190 mg/dL) who do not have diabetes or established atherosclerotic cardiovascular disease (ASCVD).* In intermediate-risk (10-year ASCVD risk >=7.5% to <20%) adults or selected borderline-risk (10-year ASCVD risk >=5% to <7.5%) adults in whom a CAC score is measured for the purpose of  making a treatment decision the following recommendations have been made: If CAC=0, it is reasonable to withhold statin therapy and reassess in 5 to 10 years, as long as higher risk conditions are absent (diabetes mellitus, family history of premature CHD in first degree relatives (males <55 years; females <65 years), cigarette smoking, or LDL >=190 mg/dL). If CAC is 1 to 99, it is reasonable to initiate statin therapy for patients >=45 years of age. If CAC is >=100 or >=75th percentile, it is reasonable to initiate statin therapy at any age. Cardiology referral should be considered for patients with CAC scores >=400 or >=75th percentile. *2018 AHA/ACC/AACVPR/AAPA/ABC/ACPM/ADA/AGS/APhA/ASPC/NLA/PCNA Guideline on the Management of Blood Cholesterol: A Report of the American College of Cardiology/American Heart Association Task Force on Clinical Practice Guidelines. J Am Coll Cardiol. 2019;73(24):3168-3209. Eleonore Chiquito, MD Electronically Signed   By: Eleonore Chiquito   On: 03/05/2021 12:30   Result Date: 03/05/2021 EXAM: OVER-READ INTERPRETATION  CT CHEST The following report is an over-read performed by radiologist Dr. Vinnie Langton of [facility], [place] on 03/05/2021. This over-read does not include interpretation of cardiac or coronary anatomy or pathology. The coronary calcium score interpretation by the cardiologist is attached.  COMPARISON:  None. FINDINGS: Within the visualized portions of the thorax there are no suspicious appearing pulmonary nodules or masses, there is no acute consolidative airspace disease, no pleural effusions, no pneumothorax and no lymphadenopathy. Visualized portions of the upper abdomen are unremarkable. There are no aggressive appearing lytic or blastic lesions noted in the visualized portions of the skeleton. IMPRESSION: 1. No significant incidental noncardiac findings are noted. Electronically Signed: By: Vinnie Langton M.D. On: 03/05/2021 09:09    Assessment & Plan:    Walker Kehr, MD

## 2021-05-28 NOTE — Telephone Encounter (Signed)
[facility] FYI 7.16.22:  ---Caller says that he is a PT of Dr. Quay Burow. Caller has an appt. with Dr. Alain Marion on Monday. Caller went to the UC yesterday because he has had a cough with green/yellow mucus and was prescribed Doxycycline and Mucinex yesterday. Caller started getting nausea yesterday. Caller wonders if should he discontinue one or both meds?   Advised to be seen within 24 hours

## 2021-05-28 NOTE — Telephone Encounter (Signed)
Patient saw Dr. Bronwen Betters this morning.

## 2021-05-28 NOTE — Telephone Encounter (Signed)
Hyperinflated lungs with question 6 mm RIGHT upper lobe nodular density; CT chest recommended to exclude pulmonary nodule

## 2021-05-29 NOTE — Telephone Encounter (Signed)
Dear Mr Catterton, Your chest X ray did not show pneumonia. It showed  "a 6 mm RIGHT upper lobe nodular density; CT chest recommended to exclude pulmonary nodule."  However, since you had a normal chest CT on 03/05/21, I think you are ok and do not needanother CT. Please follow up with Dr Quay Burow. Feel better! Sincerely, AP  Written by Cassandria Anger, MD on 05/28/2021  5:07 PM EDT Seen by patient Jeremy Ellis on 05/28/2021  5:35 PM

## 2021-06-04 NOTE — Progress Notes (Signed)
Subjective:    Patient ID: Jeremy Ellis, male    DOB: 07-02-1961, 60 y.o.   MRN: [id number]  HPI The patient is here for follow up from his 7/18 visit.  Had cough x 1/5 weeks - productive. Associated with fatigue, b/l ear congestion.  Went to urgent care - rx'd doxycycline - stopped it due to nausea.  Started on omnicef, zofran prn.    Did not take omnicef or zofran - he does feel better.    CXR showed nodular density.  He states a little bit of a cough that continues to improve.  He does bring up some clear mucus.  He denies any fevers, shortness of breath or wheeze.  Medications and allergies reviewed with patient and updated if appropriate.  Patient Active Problem List   Diagnosis Date Noted   Cerumen impaction 05/28/2021   Acute bronchitis 05/28/2021   Nausea 05/28/2021   Chest discomfort 02/02/2021   Palpitations 09/20/2020   Grief 02/04/2020   Vertigo 12/22/2018   Rosacea 05/15/2017   Allergic rhinitis 05/15/2017   Prediabetes 11/06/2016   Raynaud's phenomenon 06/01/2015   Personal history of colonic polyps 03/14/2014   ILEITIS 01/24/2011   GERD 09/15/2009   Hyperlipidemia 03/21/2009   BPH (benign prostatic hyperplasia) 03/18/2008   HERNIATED DISC 12/11/2006    Current Outpatient Medications on File Prior to Visit  Medication Sig Dispense Refill   Acetaminophen 500 MG coapsule      bifidobacterium infantis (ALIGN) capsule Every other day     cefdinir (OMNICEF) 300 MG capsule Take 1 capsule (300 mg total) by mouth 2 (two) times daily. 20 capsule 0   Cholecalciferol (VITAMIN D3) 400 UNITS CAPS Take by mouth daily.     COVID-19 mRNA vaccine, Pfizer, 30 MCG/0.3ML injection USE AS DIRECTED .3 mL 0   ECHINACEA PO Take by mouth 2 (two) times daily as needed (immune support). Reported on 11/02/2015     famotidine (PEPCID) 40 MG tablet Take 1 tablet (40 mg total) by mouth daily as needed for heartburn or indigestion. 30 tablet 5   Multiple Vitamin (MULTIVITAMIN)  tablet Take 1 tablet by mouth daily.     OMEGA-3 FATTY ACIDS-VITAMIN E PO      ondansetron (ZOFRAN) 4 MG tablet Take 1 tablet (4 mg total) by mouth 2 (two) times daily as needed for nausea or vomiting. 20 tablet 0   Saw Palmetto 450 MG CAPS Take by mouth. 1 by mouth two times daily     vitamin C (ASCORBIC ACID) 500 MG tablet Take 500 mg by mouth daily.     No current facility-administered medications on file prior to visit.    Past Medical History:  Diagnosis Date   Allergy    Anxiety    situational in 2020-   Arthritis    GERD (gastroesophageal reflux disease)    Hyperlipidemia    fam hx- no meds     Past Surgical History:  Procedure Laterality Date   COLONOSCOPY  2002    Ileitis on Biospy, Dr.Perry   colonoscopy with polypectomy  12/20/11   sessile serrated polyp   POLYPECTOMY      Social History   Socioeconomic History   Marital status: Single    Spouse name: Not on file   Number of children: Not on file   Years of education: Not on file   Highest education level: Not on file  Occupational History   Occupation: Scientist  Tobacco Use   Smoking status: Never  Smokeless tobacco: Never  Substance and Sexual Activity   Alcohol use: Yes    Alcohol/week: 1.0 standard drink    Types: 1 Glasses of wine per week    Comment: Red wine 3-4x monthly     Drug use: No   Sexual activity: Not on file  Other Topics Concern   Not on file  Social History Narrative   Exercise - 5 days a week - cardio for 30-40 minutes   Social Determinants of Health   Financial Resource Strain: Not on file  Food Insecurity: Not on file  Transportation Needs: Not on file  Physical Activity: Not on file  Stress: Not on file  Social Connections: Not on file    Family History  Problem Relation Age of Onset   Colon cancer Paternal Grandmother 27   Stroke Father 56       hemorrhagic   Heart failure Father    Heart attack Father 68   Diabetes Father    Hyperlipidemia Mother    Diabetes  Mother    Other Mother        giant cell arteritis   Heart attack Paternal Grandfather 57   Heart attack Maternal Grandfather        in 80s   Diabetes Maternal Grandmother    Alzheimer's disease Maternal Grandmother    Heart failure Daughter    Stomach cancer Neg Hx    Asthma Neg Hx    COPD Neg Hx    Colon polyps Neg Hx    Esophageal cancer Neg Hx    Rectal cancer Neg Hx     Review of Systems  Constitutional:  Negative for fever.  HENT:  Negative for congestion, ear pain (right ear sizzling sound, clogged in morning) and sore throat.   Respiratory:  Positive for cough (minimal - clear sputum - improved). Negative for chest tightness, shortness of breath and wheezing.   Neurological:  Negative for headaches.      Objective:   Vitals:   06/05/21 0851  BP: 106/72  Pulse: 69  Temp: 98.2 F (36.8 C)  SpO2: 97%   BP Readings from Last 3 Encounters:  06/05/21 106/72  05/28/21 110/70  03/28/21 102/68   Wt Readings from Last 3 Encounters:  06/05/21 141 lb (64 kg)  05/28/21 140 lb 12.8 oz (63.9 kg)  03/28/21 142 lb 9.6 oz (64.7 kg)   Body mass index is 19.67 kg/m.   Physical Exam    GENERAL APPEARANCE: Appears stated age, well appearing, NAD EYES: conjunctiva clear, no icterus HENT: Right ear canal with excessive cerumen-unable to visualize TM, left ear canal with minimal cerumen, TM normal, oropharynx with no erythema or exudates, trachea midline, no cervical or supraclavicular lymphadenopathy LUNGS: Unlabored breathing, good air entry bilaterally, clear to auscultation without wheeze or crackles CARDIOVASCULAR: Normal S1,S2 , no edema SKIN: Warm, dry  DG Chest 2 View CLINICAL DATA:  61 year old male with follow-up of nodule on prior chest x-ray  EXAM: CHEST - 2 VIEW  COMPARISON:  05/28/2021  FINDINGS: Cardiomediastinal silhouette unchanged in size and contour. No evidence of central vascular congestion. No interlobular septal thickening.  Coarsened  interstitial markings persist with no new confluent airspace disease.  Nodule at the right lung apex persists, with questionable internal lucency compared to the prior.  No pneumothorax or pleural effusion.  No acute displaced fracture. Degenerative changes of the spine.  IMPRESSION: Persisting right upper lobe nodule with questionable internal lucency compared to the prior, suggesting inflammatory/infectious etiology. Further  evaluation with either repeat chest x-ray in 4-6 weeks is recommended, versus chest CT at this time.  Electronically Signed   By: Corrie Mckusick D.O.   On: 06/05/2021 11:48     Assessment & Plan:    See Problem List for Assessment and Plan of chronic medical problems.    This visit occurred during the SARS-CoV-2 public health emergency.  Safety protocols were in place, including screening questions prior to the visit, additional usage of staff PPE, and extensive cleaning of exam room while observing appropriate contact time as indicated for disinfecting solutions.

## 2021-06-05 ENCOUNTER — Encounter: Payer: Self-pay | Admitting: Internal Medicine

## 2021-06-05 ENCOUNTER — Ambulatory Visit: Payer: Managed Care, Other (non HMO) | Admitting: Internal Medicine

## 2021-06-05 ENCOUNTER — Ambulatory Visit (INDEPENDENT_AMBULATORY_CARE_PROVIDER_SITE_OTHER): Payer: Managed Care, Other (non HMO)

## 2021-06-05 ENCOUNTER — Other Ambulatory Visit: Payer: Self-pay

## 2021-06-05 VITALS — BP 106/72 | HR 69 | Temp 98.2°F | Ht 71.0 in | Wt 141.0 lb

## 2021-06-05 DIAGNOSIS — R911 Solitary pulmonary nodule: Secondary | ICD-10-CM

## 2021-06-05 DIAGNOSIS — H6121 Impacted cerumen, right ear: Secondary | ICD-10-CM

## 2021-06-05 DIAGNOSIS — J209 Acute bronchitis, unspecified: Secondary | ICD-10-CM | POA: Diagnosis not present

## 2021-06-05 NOTE — Assessment & Plan Note (Signed)
Chest x-ray done last week showed possible right upper lobe nodule CT of the chest done 3 months ago was normal ?  True nodule not Will repeat chest x-ray today and then determine if CT scan is needed or not-chest x-ray shows persistent right upper lobe nodule with questionable lucency-more likely infectious Repeat chest x-ray in 6 weeks and then determine if CT is necessary

## 2021-06-05 NOTE — Assessment & Plan Note (Signed)
Subacute Still has impacted cerumen in his right ear-does have a sizzling sound and clogged sensation at times Discussed at home treatment with Debrox or water/hydrogen peroxide

## 2021-06-05 NOTE — Assessment & Plan Note (Signed)
Subacute Did not take any antibiotic and has much improved Still has some residual cough, which should continue to improve No further treatment needed

## 2021-06-05 NOTE — Patient Instructions (Signed)
  Have a chest xray today downstairs.

## 2021-06-20 ENCOUNTER — Encounter: Payer: Self-pay | Admitting: Internal Medicine

## 2021-06-20 DIAGNOSIS — M79601 Pain in right arm: Secondary | ICD-10-CM

## 2021-06-20 DIAGNOSIS — H6120 Impacted cerumen, unspecified ear: Secondary | ICD-10-CM

## 2021-06-26 ENCOUNTER — Encounter: Payer: Self-pay | Admitting: Internal Medicine

## 2021-06-26 NOTE — Addendum Note (Signed)
Addended by: Binnie Rail on: 06/26/2021 07:31 AM   Modules accepted: Orders

## 2021-07-02 ENCOUNTER — Encounter: Payer: Self-pay | Admitting: Internal Medicine

## 2021-07-03 NOTE — Progress Notes (Signed)
Jeremy Ellis Phone: [phone] Subjective:    I'm seeing this patient by the request  of:  Binnie Rail, MD  CC: right shoulder and neck pain   [id number]  Jeremy Ellis is a 60 y.o. male coming in with complaint of R shoulder and neck pain. Patient states pain has been going on for a couple weeks taking '500mg'$  acetaminophen daily. Locates pain to top of shoulder radiates up the R side of neck, has tingling in hands/fingers. Patient has stopped resistance training because it was making the shoulder worse. Neck soreness is worse when turning neck to the left. Describes pain as a dull toothache. Patient wants to know if he can go back to the gym and if so when or what can he do or avoid.        Past Medical History:  Diagnosis Date   Allergy    Anxiety    situational in 2020-   Arthritis    GERD (gastroesophageal reflux disease)    Hyperlipidemia    fam hx- no meds    Past Surgical History:  Procedure Laterality Date   COLONOSCOPY  2002    Ileitis on Biospy, Dr.Perry   colonoscopy with polypectomy  12/20/11   sessile serrated polyp   POLYPECTOMY     Social History   Socioeconomic History   Marital status: Single    Spouse name: Not on file   Number of children: Not on file   Years of education: Not on file   Highest education level: Not on file  Occupational History   Occupation: Scientist  Tobacco Use   Smoking status: Never   Smokeless tobacco: Never  Substance and Sexual Activity   Alcohol use: Yes    Alcohol/week: 1.0 standard drink    Types: 1 Glasses of wine per week    Comment: Red wine 3-4x monthly     Drug use: No   Sexual activity: Not on file  Other Topics Concern   Not on file  Social History Narrative   Exercise - 5 days a week - cardio for 30-40 minutes   Social Determinants of Health   Financial Resource Strain: Not on file  Food Insecurity: Not on file   Transportation Needs: Not on file  Physical Activity: Not on file  Stress: Not on file  Social Connections: Not on file   Allergies  Allergen Reactions   Erythromycin Swelling   Penicillins Hives and Swelling    Has patient had a PCN reaction causing immediate rash, facial/tongue/throat swelling, SOB or lightheadedness with hypotension: Yes Has patient had a PCN reaction causing severe rash involving mucus membranes or skin necrosis: No Has patient had a PCN reaction that required hospitalization No Has patient had a PCN reaction occurring within the last 10 years: No If all of the above answers are "NO", then may proceed with Cephalosporin use.    Sulfonamide Derivatives Hives and Swelling   Doxycycline     Nausea   Family History  Problem Relation Age of Onset   Colon cancer Paternal Grandmother 83   Stroke Father 3       hemorrhagic   Heart failure Father    Heart attack Father 56   Diabetes Father    Hyperlipidemia Mother    Diabetes Mother    Other Mother        giant cell arteritis   Heart attack Paternal Grandfather 9   Heart  attack Maternal Grandfather        in 104s   Diabetes Maternal Grandmother    Alzheimer's disease Maternal Grandmother    Heart failure Daughter    Stomach cancer Neg Hx    Asthma Neg Hx    COPD Neg Hx    Colon polyps Neg Hx    Esophageal cancer Neg Hx    Rectal cancer Neg Hx        Current Outpatient Medications (Analgesics):    Acetaminophen 500 MG coapsule,    Current Outpatient Medications (Other):    bifidobacterium infantis (ALIGN) capsule, Every other day   Cholecalciferol (VITAMIN D3) 400 UNITS CAPS, Take by mouth daily.   COVID-19 mRNA vaccine, Pfizer, 30 MCG/0.3ML injection, USE AS DIRECTED   ECHINACEA PO, Take by mouth 2 (two) times daily as needed (immune support). Reported on 11/02/2015   gabapentin (NEURONTIN) 100 MG capsule, Take two capsules by mouth daily at bedtime (total '200mg'$ )   Multiple Vitamin  (MULTIVITAMIN) tablet, Take 1 tablet by mouth daily.   OMEGA-3 FATTY ACIDS-VITAMIN E PO,    Saw Palmetto 450 MG CAPS, Take by mouth. 1 by mouth two times daily   vitamin C (ASCORBIC ACID) 500 MG tablet, Take 500 mg by mouth daily.   famotidine (PEPCID) 40 MG tablet, Take 1 tablet (40 mg total) by mouth daily as needed for heartburn or indigestion. (Patient not taking: Reported on 07/04/2021)   Reviewed prior external information including notes and imaging from  primary care provider As well as notes that were available from care everywhere and other healthcare systems.  Past medical history, social, surgical and family history all reviewed in electronic medical record.  No pertanent information unless stated regarding to the chief complaint.   Review of Systems:  No headache, visual changes, nausea, vomiting, diarrhea, constipation, dizziness, abdominal pain, skin rash, fevers, chills, night sweats, weight loss, swollen lymph nodes, body aches, joint swelling, chest pain, shortness of breath, mood changes. POSITIVE muscle aches  Objective  Blood pressure 90/60, pulse 71, height '5\' 11"'$  (1.803 m), weight 141 lb (64 kg), SpO2 96 %.   General: No apparent distress alert and oriented x3 mood and affect normal, dressed appropriately.  HEENT: Pupils equal, extraocular movements intact  Respiratory: Patient's speak in full sentences and does not appear short of breath  Cardiovascular: No lower extremity edema, non tender, no erythema  Gait normal with good balance and coordination.  MSK:  Non tender with full range of motion and good stability and symmetric strength and tone of shoulders, elbows, wrist, hip, knee and ankles bilaterally.  Right shoulder exam shows the patient has a positive impingement noted.  Mild limited range of motion with internal and external rotation.  Patient's neck exam has significant decreased range of motion.  Mild positive Spurling's on the right side.  Limited  sidebending especially to the right and left-sided rotation.  Patient does have 5-5 strength noted of the upper extremities on the right side.  Limited muscular skeletal ultrasound was performed and interpreted by Hulan Saas, M  Ultrasound shows the patient's rotator cuff does appear to be intact.  Mild arthritic changes of the acromioclavicular joint.  Patient does have some mild abnormalities noted of the glenohumeral joint questionable thinning of the cortex. Impression: Nonspecific findings on ultrasound.   Impression and Recommendations:     The above documentation has been reviewed and is accurate and complete Lyndal Pulley, DO

## 2021-07-04 ENCOUNTER — Other Ambulatory Visit: Payer: Self-pay

## 2021-07-04 ENCOUNTER — Encounter: Payer: Self-pay | Admitting: Family Medicine

## 2021-07-04 ENCOUNTER — Ambulatory Visit: Payer: Managed Care, Other (non HMO) | Admitting: Family Medicine

## 2021-07-04 ENCOUNTER — Ambulatory Visit (INDEPENDENT_AMBULATORY_CARE_PROVIDER_SITE_OTHER): Payer: Managed Care, Other (non HMO)

## 2021-07-04 ENCOUNTER — Ambulatory Visit: Payer: Self-pay

## 2021-07-04 VITALS — BP 90/60 | HR 71 | Ht 71.0 in | Wt 141.0 lb

## 2021-07-04 DIAGNOSIS — M25511 Pain in right shoulder: Secondary | ICD-10-CM

## 2021-07-04 DIAGNOSIS — M542 Cervicalgia: Secondary | ICD-10-CM

## 2021-07-04 DIAGNOSIS — M5030 Other cervical disc degeneration, unspecified cervical region: Secondary | ICD-10-CM | POA: Insufficient documentation

## 2021-07-04 MED ORDER — GABAPENTIN 100 MG PO CAPS: ORAL_CAPSULE | ORAL | 3 refills | Status: DC

## 2021-07-04 NOTE — Assessment & Plan Note (Signed)
Discussed with patient at great length.  Discussed with patient about doing icing regimen, home exercises, discussed with formal physical therapy.  Increase activity slowly.  Patient will follow up with me again 6 weeks

## 2021-07-04 NOTE — Assessment & Plan Note (Signed)
Patient does have degenerative disc disease.  Discussed icing regimen and home exercises.  Discussed which activities to do which wants to avoid.  Encourage patient to increase activity slowly.  We will start gabapentin and will start with formal physical therapy.  X-rays are pending.  Follow-up with me again in 6 weeks otherwise.  I do believe patient will do well with conservative therapy.

## 2021-07-04 NOTE — Patient Instructions (Addendum)
Good to see you  Get Xrays on your way out  Shoulder exercises given  Gabapentin '200mg'$  at night if too sleepy in am go to '100mg'$  Keep hands in Periferal view Can start back up machine weights PT at [facility] See me again in 6 weeks

## 2021-07-26 ENCOUNTER — Encounter: Payer: Self-pay | Admitting: Internal Medicine

## 2021-07-30 ENCOUNTER — Encounter: Payer: Self-pay | Admitting: Family Medicine

## 2021-07-31 ENCOUNTER — Encounter: Payer: Self-pay | Admitting: Internal Medicine

## 2021-08-01 ENCOUNTER — Other Ambulatory Visit: Payer: Self-pay

## 2021-08-01 ENCOUNTER — Ambulatory Visit: Payer: Managed Care, Other (non HMO) | Admitting: Sports Medicine

## 2021-08-01 VITALS — BP 122/82 | HR 73 | Ht 71.0 in | Wt 141.0 lb

## 2021-08-01 DIAGNOSIS — M25511 Pain in right shoulder: Secondary | ICD-10-CM | POA: Diagnosis not present

## 2021-08-01 DIAGNOSIS — M5030 Other cervical disc degeneration, unspecified cervical region: Secondary | ICD-10-CM | POA: Diagnosis not present

## 2021-08-01 DIAGNOSIS — M19011 Primary osteoarthritis, right shoulder: Secondary | ICD-10-CM | POA: Diagnosis not present

## 2021-08-01 NOTE — Patient Instructions (Addendum)
Good to see you  Right shoulder injection given today Start physical therapy  Continue Home exercise program Can take tylenol or Ibuprofen as needed for pain  See me again as needed or in 2-4 weeks if no improvement

## 2021-08-01 NOTE — Progress Notes (Signed)
Jeremy Ellis D.Jeremy Ellis [place] Phone: [phone]   Assessment and Plan:     1. Arthritis of right acromioclavicular joint 2. Acute pain of right shoulder -Acute uncomplicated, subsequent sports medicine visit - X-ray showing moderate AC joint osteoarthritis, and patient's physical exam showing nonspecific generalized shoulder pain - Patient elected to proceed with CSI subacromial.  Tolerated well per procedure note below.  Could consider AC joint injection at follow-up visit if his symptoms become more localized to this area - Start physical therapy previously prescribed - Continue HEP - Tylenol/NSAIDs as needed for pain control  3. Degenerative disc disease, cervical -Chronic, subsequent sports medicine visit - Unclear at this time if patient's neck pain is from underlying DDD or overcompensation for right shoulder pain. - Continue HEP and start PT - If no improvement or worsening of symptoms could consider advanced imaging versus cervical injection    Pertinent previous records reviewed include previous sports medicine office note, C-spine x-ray, shoulder x-ray   Procedure: Subacromial Injection Side: Right  Risks explained and consent was given verbally. The site was cleaned with alcohol prep. A steroid injection was performed from posterior approach using 4mL of 1% lidocaine without epinephrine and 2mL of kenalog 40mg /ml. This was well tolerated and resulted in symptomatic relief.  Needle was removed, hemostasis achieved, and post injection instructions were explained.   Pt was advised to call or return to clinic if these symptoms worsen or fail to improve as anticipated.   Follow Up: Follow-up in 2 to 4 weeks as needed if no improvement or worsening of symptoms.  Could consider AC joint injection at follow-up visit if his symptoms become more localized to this area. If no improvement or worsening of symptoms  could consider advanced imaging versus cervical injection     Subjective:   I, Jeremy Ellis, am serving as a scribe for Dr. Glennon Ellis  Chief Complaint: right shoulder and neck pain   HPI: 60 year old male presenting with persistent right shoulder and neck pain   08/01/21 Patient states that he has been having persistent right shoulder and neck pain causing tingling and numbness in the right arm, hand, and fingers. Neck pain is located to the back of his neck and describes pain ans stiff, sore and numb. Patient has tried the tylenol 500mg  and the HEP exercises that he was given which will take the "Edge" off but pain will still be like a persistent toothache.  Relevant Historical Information: C-spine DDD  Additional pertinent review of systems negative.   Current Outpatient Medications:    Acetaminophen 500 MG coapsule, , Disp: , Rfl:    bifidobacterium infantis (ALIGN) capsule, Every other day, Disp: , Rfl:    Cholecalciferol (VITAMIN D3) 400 UNITS CAPS, Take by mouth daily., Disp: , Rfl:    COVID-19 mRNA vaccine, Pfizer, 30 MCG/0.3ML injection, USE AS DIRECTED, Disp: .3 mL, Rfl: 0   ECHINACEA PO, Take by mouth 2 (two) times daily as needed (immune support). Reported on 11/02/2015, Disp: , Rfl:    gabapentin (NEURONTIN) 100 MG capsule, Take two capsules by mouth daily at bedtime (total 200mg ), Disp: 30 capsule, Rfl: 3   Multiple Vitamin (MULTIVITAMIN) tablet, Take 1 tablet by mouth daily., Disp: , Rfl:    OMEGA-3 FATTY ACIDS-VITAMIN E PO, , Disp: , Rfl:    Saw Palmetto 450 MG CAPS, Take by mouth. 1 by mouth two times daily, Disp: , Rfl:  vitamin C (ASCORBIC ACID) 500 MG tablet, Take 500 mg by mouth daily., Disp: , Rfl:    famotidine (PEPCID) 40 MG tablet, Take 1 tablet (40 mg total) by mouth daily as needed for heartburn or indigestion. (Patient not taking: No sig reported), Disp: 30 tablet, Rfl: 5   Objective:     Vitals:   08/01/21 1233  BP: 122/82  Pulse: 73  SpO2:  95%  Weight: 141 lb (64 kg)  Height: 5\' 11"  (1.803 m)      Body mass index is 19.67 kg/m.    Physical Exam:    Gen: Appears well, nad, nontoxic and pleasant Neuro:sensation intact, strength is 5/5 with df/pf/inv/ev, muscle tone wnl Skin: no suspicious lesion or defmority Psych: A&O, appropriate mood and affect  Shoulder: no deformity, swelling or muscle wasting No scapular winging FF 180, abd 180, int 0, ext 90 TTP ac, coracoid, biceps groove, humerus, deltoid, subacromial space,musculature, trap, cervical spine NTTP over the [place], clavicle, scap spine,  Neg neer, hawkings,  obriens Positive empty can, subscap liftoff, speeds, cross arm Neg apprehension Negative Spurling's test bilat FROM of neck    Electronically signed by:  Jeremy Ellis D.Jeremy Ellis [facility] 1:29 PM 08/01/21

## 2021-08-02 ENCOUNTER — Ambulatory Visit (INDEPENDENT_AMBULATORY_CARE_PROVIDER_SITE_OTHER): Payer: Managed Care, Other (non HMO) | Admitting: Otolaryngology

## 2021-08-02 DIAGNOSIS — H6123 Impacted cerumen, bilateral: Secondary | ICD-10-CM | POA: Diagnosis not present

## 2021-08-02 NOTE — Progress Notes (Signed)
HPI: Jeremy Ellis is a 60 y.o. male who presents is referred by his PCP Dr. Quay Burow for evaluation of wax buildup in his ears.  Right side worse than left..  Past Medical History:  Diagnosis Date   Allergy    Anxiety    situational in 2020-   Arthritis    GERD (gastroesophageal reflux disease)    Hyperlipidemia    fam hx- no meds    Past Surgical History:  Procedure Laterality Date   COLONOSCOPY  2002    Ileitis on Biospy, Dr.Perry   colonoscopy with polypectomy  12/20/11   sessile serrated polyp   POLYPECTOMY     Social History   Socioeconomic History   Marital status: Single    Spouse name: Not on file   Number of children: Not on file   Years of education: Not on file   Highest education level: Not on file  Occupational History   Occupation: Scientist  Tobacco Use   Smoking status: Never   Smokeless tobacco: Never  Substance and Sexual Activity   Alcohol use: Yes    Alcohol/week: 1.0 standard drink    Types: 1 Glasses of wine per week    Comment: Red wine 3-4x monthly     Drug use: No   Sexual activity: Not on file  Other Topics Concern   Not on file  Social History Narrative   Exercise - 5 days a week - cardio for 30-40 minutes   Social Determinants of Health   Financial Resource Strain: Not on file  Food Insecurity: Not on file  Transportation Needs: Not on file  Physical Activity: Not on file  Stress: Not on file  Social Connections: Not on file   Family History  Problem Relation Age of Onset   Colon cancer Paternal Grandmother 90   Stroke Father 67       hemorrhagic   Heart failure Father    Heart attack Father 47   Diabetes Father    Hyperlipidemia Mother    Diabetes Mother    Other Mother        giant cell arteritis   Heart attack Paternal Grandfather 62   Heart attack Maternal Grandfather        in 25s   Diabetes Maternal Grandmother    Alzheimer's disease Maternal Grandmother    Heart failure Daughter    Stomach cancer Neg Hx     Asthma Neg Hx    COPD Neg Hx    Colon polyps Neg Hx    Esophageal cancer Neg Hx    Rectal cancer Neg Hx    Allergies  Allergen Reactions   Erythromycin Swelling   Penicillins Hives and Swelling    Has patient had a PCN reaction causing immediate rash, facial/tongue/throat swelling, SOB or lightheadedness with hypotension: Yes Has patient had a PCN reaction causing severe rash involving mucus membranes or skin necrosis: No Has patient had a PCN reaction that required hospitalization No Has patient had a PCN reaction occurring within the last 10 years: No If all of the above answers are "NO", then may proceed with Cephalosporin use.    Sulfonamide Derivatives Hives and Swelling   Doxycycline     Nausea   Prior to Admission medications   Medication Sig Start Date End Date Taking? Authorizing Provider  Acetaminophen 500 MG coapsule  11/12/15   [provider]  bifidobacterium infantis (ALIGN) capsule Every other day 09/11/18   [provider]  Cholecalciferol (VITAMIN D3) 400  UNITS CAPS Take by mouth daily.    [provider]  COVID-19 mRNA vaccine, Pfizer, 30 MCG/0.3ML injection USE AS DIRECTED 09/26/20 09/26/21  Carlyle Basques, MD  ECHINACEA PO Take by mouth 2 (two) times daily as needed (immune support). Reported on 11/02/2015    [provider]  famotidine (PEPCID) 40 MG tablet Take 1 tablet (40 mg total) by mouth daily as needed for heartburn or indigestion. Patient not taking: No sig reported 02/02/21   Binnie Rail, MD  gabapentin (NEURONTIN) 100 MG capsule Take two capsules by mouth daily at bedtime (total 200mg ) 07/04/21   Lyndal Pulley, DO  Multiple Vitamin (MULTIVITAMIN) tablet Take 1 tablet by mouth daily.    [provider]  OMEGA-3 FATTY ACIDS-VITAMIN E PO  11/11/16   [provider]  Saw Palmetto 450 MG CAPS Take by mouth. 1 by mouth two times daily    [provider]  vitamin C (ASCORBIC ACID) 500 MG tablet  Take 500 mg by mouth daily.    [provider]     Positive ROS: Otherwise negative  All other systems have been reviewed and were otherwise negative with the exception of those mentioned in the HPI and as above.  Physical Exam: Constitutional: Alert, well-appearing, no acute distress Ears: External ears without lesions or tenderness.  Right ear canal is completely occluded with cerumen that was removed with curette and suction and hydrogen peroxide.  Left ear canal had a large amount of wax that was removed with a curette.  The TMs were clear bilaterally.  After removing the cerumen hearing screening with the 1024 tuning fork revealed symmetric hearing and reasonably good hearing in both ears. Nasal: External nose without lesions. Clear nasal passages Oral: Lips and gums without lesions. Tongue and palate mucosa without lesions. Posterior oropharynx clear. Neck: No palpable adenopathy or masses Respiratory: Breathing comfortably  Skin: No facial/neck lesions or rash noted.  Cerumen impaction removal  Date/Time: 08/02/2021 4:37 PM Performed by: Rozetta Nunnery, MD Authorized by: Rozetta Nunnery, MD   Consent:    Consent obtained:  Verbal   Consent given by:  Patient   Risks discussed:  Pain and bleeding Procedure details:    Location:  L ear and R ear   Procedure type: curette and suction   Post-procedure details:    Inspection:  TM intact and canal normal   Hearing quality:  Improved   Procedure completion:  Tolerated well, no immediate complications Comments:     Ear canals occluded with cerumen worse on the right side.  This was cleaned with hydroperoxide and suction and curettes.  TMs were clear bilaterally.  Assessment: Bilateral cerumen impactions worse on the right side  Plan: He inquired as to how to prevent wax buildup and I discussed with him concerning using Debrox or hydrogen peroxide irrigation once monthly.  He may still need to occasionally  had this cleaned out as the right ear canal was completely impacted or full of cerumen. He will follow-up as needed   Radene Journey, MD   CC:

## 2021-08-16 ENCOUNTER — Ambulatory Visit: Payer: Managed Care, Other (non HMO) | Admitting: Family Medicine

## 2021-08-27 ENCOUNTER — Encounter: Payer: Self-pay | Admitting: Internal Medicine

## 2021-08-31 ENCOUNTER — Encounter: Payer: Self-pay | Admitting: Internal Medicine

## 2021-08-31 ENCOUNTER — Encounter: Payer: Self-pay | Admitting: Family Medicine

## 2021-09-05 ENCOUNTER — Ambulatory Visit (INDEPENDENT_AMBULATORY_CARE_PROVIDER_SITE_OTHER): Payer: Managed Care, Other (non HMO)

## 2021-09-05 DIAGNOSIS — R911 Solitary pulmonary nodule: Secondary | ICD-10-CM | POA: Diagnosis not present

## 2021-09-07 ENCOUNTER — Encounter: Payer: Self-pay | Admitting: Internal Medicine

## 2021-09-14 ENCOUNTER — Other Ambulatory Visit: Payer: Self-pay

## 2021-09-14 ENCOUNTER — Ambulatory Visit: Payer: Managed Care, Other (non HMO) | Attending: Internal Medicine

## 2021-09-14 DIAGNOSIS — Z23 Encounter for immunization: Secondary | ICD-10-CM

## 2021-09-14 MED ORDER — PFIZER COVID-19 VAC BIVALENT 30 MCG/0.3ML IM SUSP
INTRAMUSCULAR | 0 refills | Status: DC
  Filled 2021-09-14: qty 0.3, 1d supply, fill #0

## 2021-09-14 NOTE — Progress Notes (Signed)
   Covid-19 Vaccination Clinic  Name:  Jordon Kristiansen    MRN: [id number] DOB: [birth year]  09/14/2021  Mr. Szymborski was observed post Covid-19 immunization for 15 minutes without incident. He was provided with Vaccine Information Sheet and instruction to access the V-Safe system.   Mr. Buffone was instructed to call 911 with any severe reactions post vaccine: Difficulty breathing  Swelling of face and throat  A fast heartbeat  A bad rash all over body  Dizziness and weakness   Immunizations Administered     Name Date Dose VIS Date Route   Pfizer Covid-19 Vaccine Bivalent Booster 09/14/2021  2:46 PM 0.3 mL 07/11/2021 Intramuscular   Manufacturer: [employer]   Lot: [place]: [id number]      Lu Duffel, PharmD, MBA Clinical Acute Care Pharmacist

## 2021-09-17 ENCOUNTER — Ambulatory Visit: Payer: Managed Care, Other (non HMO)

## 2021-09-24 ENCOUNTER — Ambulatory Visit: Payer: Managed Care, Other (non HMO)

## 2021-09-26 ENCOUNTER — Ambulatory Visit: Payer: Managed Care, Other (non HMO)

## 2021-09-27 ENCOUNTER — Ambulatory Visit: Payer: Managed Care, Other (non HMO)

## 2021-11-14 ENCOUNTER — Ambulatory Visit: Payer: Managed Care, Other (non HMO) | Attending: Family Medicine

## 2021-11-16 ENCOUNTER — Encounter: Payer: Self-pay | Admitting: Internal Medicine

## 2021-11-21 ENCOUNTER — Ambulatory Visit (INDEPENDENT_AMBULATORY_CARE_PROVIDER_SITE_OTHER): Payer: Managed Care, Other (non HMO)

## 2021-11-21 ENCOUNTER — Other Ambulatory Visit: Payer: Self-pay

## 2021-11-21 DIAGNOSIS — Z23 Encounter for immunization: Secondary | ICD-10-CM | POA: Diagnosis not present

## 2021-11-21 NOTE — Progress Notes (Signed)
Pt was given TDAP vacc w/o any complications.

## 2021-11-22 IMAGING — DX DG CHEST 2V
2 series · 2 of 2 positions shown · non-contrast
Comparison: 06/05/2021

CLINICAL DATA: Lung nodule

EXAM:
CHEST - 2 VIEW

[chest pa]
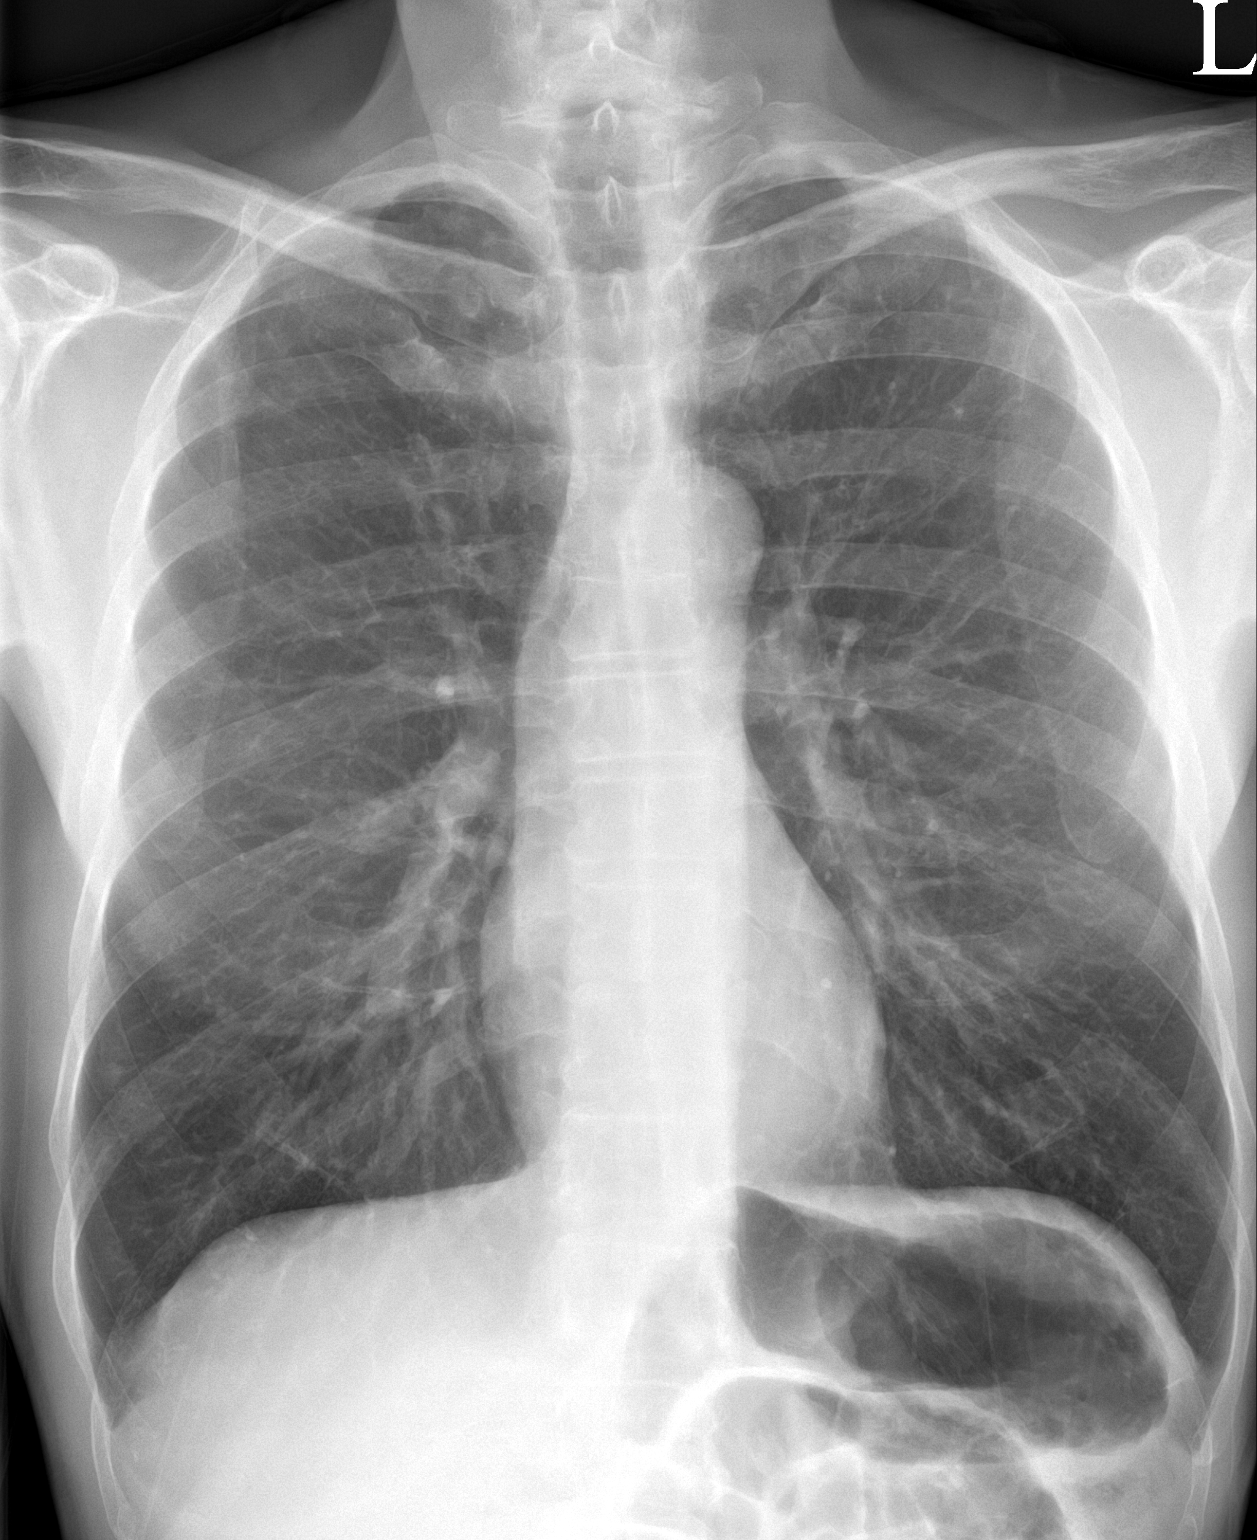

[chest lat]
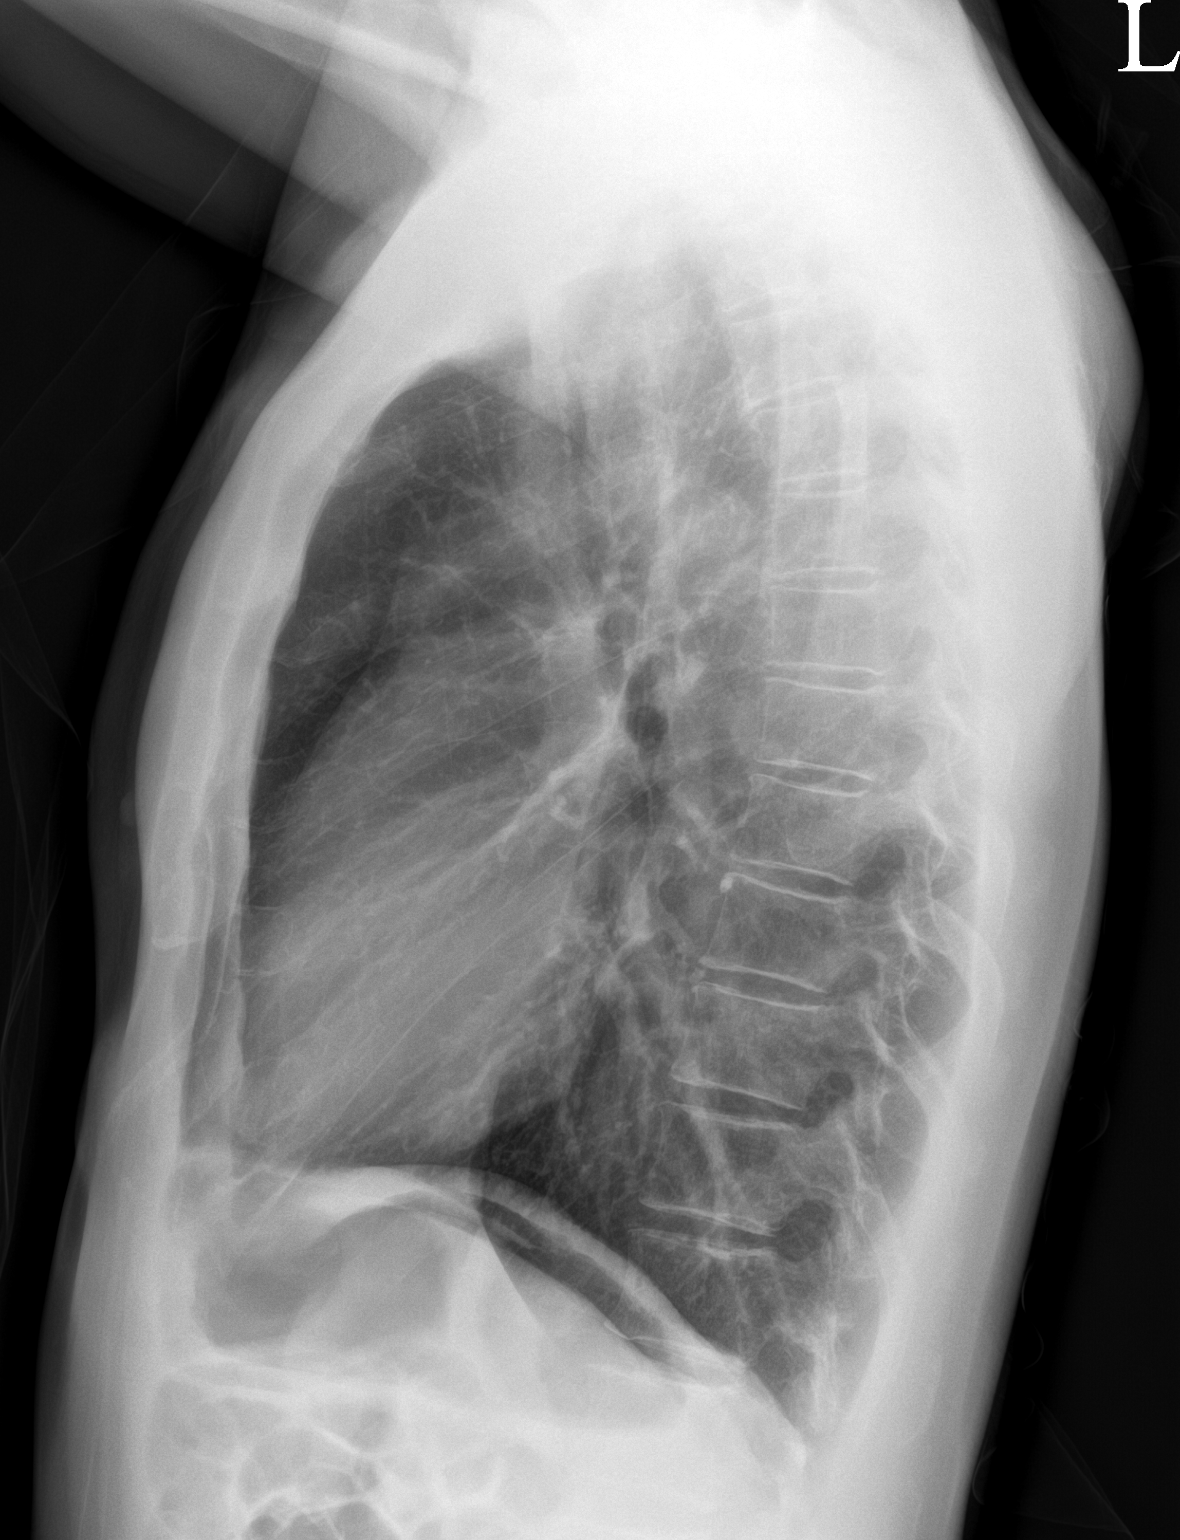

[2 of 2 positions shown; findings below may reference images not displayed]

FINDINGS: Normal heart size, mediastinal contours, and pulmonary vascularity.

Lungs hyperinflated but clear.

No pleural effusion or pneumothorax.

Questionable vague nodular density at the RIGHT upper lobe is less
prominent but not resolved.

Additional nodule lower lateral RIGHT chest, question nipple shadow.

No acute osseous findings.
IMPRESSION: Vague RIGHT upper lobe nodule persists with additional nodular
density at the lateral RIGHT lung base though this could represent a
nipple shadow; CT chest recommended to exclude RIGHT upper lobe
pulmonary nodule.

## 2021-12-28 ENCOUNTER — Encounter: Payer: Self-pay | Admitting: Internal Medicine

## 2022-01-29 ENCOUNTER — Encounter: Payer: Self-pay | Admitting: Internal Medicine

## 2022-01-29 DIAGNOSIS — E782 Mixed hyperlipidemia: Secondary | ICD-10-CM

## 2022-01-29 DIAGNOSIS — Z125 Encounter for screening for malignant neoplasm of prostate: Secondary | ICD-10-CM

## 2022-01-29 DIAGNOSIS — R7303 Prediabetes: Secondary | ICD-10-CM

## 2022-01-29 DIAGNOSIS — Z0000 Encounter for general adult medical examination without abnormal findings: Secondary | ICD-10-CM

## 2022-02-06 ENCOUNTER — Ambulatory Visit (INDEPENDENT_AMBULATORY_CARE_PROVIDER_SITE_OTHER): Payer: Managed Care, Other (non HMO)

## 2022-02-06 DIAGNOSIS — Z23 Encounter for immunization: Secondary | ICD-10-CM

## 2022-02-06 NOTE — Progress Notes (Signed)
Pt was given Prevnar 13 w/o any complications. ?

## 2022-03-06 ENCOUNTER — Encounter: Payer: Self-pay | Admitting: Internal Medicine

## 2022-03-07 ENCOUNTER — Encounter: Payer: Self-pay | Admitting: Internal Medicine

## 2022-03-10 ENCOUNTER — Encounter: Payer: Self-pay | Admitting: Internal Medicine

## 2022-03-10 NOTE — Progress Notes (Signed)
? ? ?Subjective:  ? ? Patient ID: Jeremy Ellis, male    DOB: 01-15-1961, 61 y.o.   MRN: [id number] ? ? ?This visit occurred during the SARS-CoV-2 public health emergency.  Safety protocols were in place, including screening questions prior to the visit, additional usage of staff PPE, and extensive cleaning of exam room while observing appropriate contact time as indicated for disinfecting solutions. ? ? ?HPI ?Jeremy Ellis is here for  ?Chief Complaint  ?Patient presents with  ? Annual Exam  ? ? ? ?Weight has been stable, but he would like to gain weight and is having difficulty doing that.  He is exercising, but not nearly as much as he used to.  He is very active and has high energy.  He has shared his eating habits and is eating well-I did recommend previously to increase portions and increase higher calorie foods. ? ?Occ fatigue.  Sleeps good.   ? ? ? ?Medications and allergies reviewed with patient and updated if appropriate. ? ? ?Current Outpatient Medications on File Prior to Visit  ?Medication Sig Dispense Refill  ? Acetaminophen 500 MG coapsule     ? bifidobacterium infantis (ALIGN) capsule Every other day    ? Cholecalciferol (VITAMIN D3) 400 UNITS CAPS Take by mouth daily.    ? ECHINACEA PO Take by mouth 2 (two) times daily as needed (immune support). Reported on 11/02/2015    ? Multiple Vitamin (MULTIVITAMIN) tablet Take 1 tablet by mouth daily.    ? OMEGA-3 FATTY ACIDS-VITAMIN E PO     ? Saw Palmetto 450 MG CAPS Take by mouth. 1 by mouth two times daily    ? vitamin C (ASCORBIC ACID) 500 MG tablet Take 500 mg by mouth daily.    ? ?No current facility-administered medications on file prior to visit.  ? ? ?Review of Systems  ?Constitutional:  Negative for chills and fever.  ?Eyes:  Negative for visual disturbance.  ?Respiratory:  Negative for cough, shortness of breath and wheezing.   ?Cardiovascular:  Negative for chest pain, palpitations and leg swelling.  ?Gastrointestinal:  Positive for nausea (rare).  Negative for abdominal pain, blood in stool, constipation and diarrhea.  ?     No gerd  ?Genitourinary:  Negative for difficulty urinating, dysuria and hematuria.  ?Musculoskeletal:  Positive for arthralgias (right thumb pain occ). Negative for back pain.  ?Skin:  Negative for rash.  ?Neurological:  Positive for light-headedness (occ orthostatic) and headaches (occ).  ?Psychiatric/Behavioral:  Negative for dysphoric mood. The patient is not nervous/anxious.   ? ?   ?Objective:  ? ?Vitals:  ? 03/14/22 0812  ?BP: 122/78  ?Pulse: 73  ?Temp: 98 ?F (36.7 ?C)  ?SpO2: 98%  ? ?Filed Weights  ? 03/14/22 7741  ?Weight: 138 lb 12.8 oz (63 kg)  ? ?Body mass index is 19.36 kg/m?. ? ?BP Readings from Last 3 Encounters:  ?03/14/22 122/78  ?08/01/21 122/82  ?07/04/21 90/60  ? ? ?Wt Readings from Last 3 Encounters:  ?03/14/22 138 lb 12.8 oz (63 kg)  ?08/01/21 141 lb (64 kg)  ?07/04/21 141 lb (64 kg)  ? ? ?  ?Physical Exam ?Constitutional: He appears well-developed and well-nourished. No distress.  ?HENT:  ?Head: Normocephalic and atraumatic.  ?Right Ear: External ear normal.  ?Left Ear: External ear normal.  ?Mouth/Throat: Oropharynx is clear and moist.  ?Normal ear canals and TM b/l  ?Eyes: Conjunctivae and EOM are normal.  ?Neck: Neck supple. No tracheal deviation present. No thyromegaly present.  ?No  carotid bruit  ?Cardiovascular: Normal rate, regular rhythm, normal heart sounds and intact distal pulses.   ?No murmur heard. ?Pulmonary/Chest: Effort normal and breath sounds normal. No respiratory distress. He has no wheezes. He has no rales.  ?Abdominal: Soft. He exhibits no distension. There is no tenderness.  ?Genitourinary: deferred  ?Musculoskeletal: He exhibits no edema.  ?Lymphadenopathy:   He has no cervical adenopathy.  ?Skin: Skin is warm and dry. He is not diaphoretic.  ?Psychiatric: He has a normal mood and affect. His behavior is normal.  ? ? ? ? ?   ?Assessment & Plan:  ? ?Physical exam: ?Screening blood work   pending ?Exercise   regular  ?Weight  normal - on low side - trying to gain weight ?Substance abuse   none ? ? ?Reviewed recommended immunizations. ? ? ?Health Maintenance  ?Topic Date Due  ? INFLUENZA VACCINE  06/11/2022  ? COLONOSCOPY (Pts 45-56yr Insurance coverage will need to be confirmed)  11/28/2029  ? TETANUS/TDAP  11/22/2031  ? COVID-19 Vaccine  Completed  ? Hepatitis C Screening  Completed  ? HIV Screening  Completed  ? Zoster Vaccines- Shingrix  Completed  ? HPV VACCINES  Aged Out  ? ? ? ?See Problem List for Assessment and Plan of chronic medical problems. ? ? ?

## 2022-03-10 NOTE — Patient Instructions (Addendum)
? ?To help you gain weight try adding in more whole grains, dried fruit, potatoes, avocados, nuts, peanut butter, oily fish, eggs, and dairy products.   You can do protein drinks, smoothies with protein powder.   ? ? ? ?Medications changes include :   None ? ? ?A Ct of your lung was ordered to follow up your lung nodule.   ? ? ?Return in about 1 year (around 03/15/2023) for Physical Exam. ? ? ?Health Maintenance, Male ?Adopting a healthy lifestyle and getting preventive care are important in promoting health and wellness. Ask your health care provider about: ?The right schedule for you to have regular tests and exams. ?Things you can do on your own to prevent diseases and keep yourself healthy. ?What should I know about diet, weight, and exercise? ?Eat a healthy diet ? ?Eat a diet that includes plenty of vegetables, fruits, low-fat dairy products, and lean protein. ?Do not eat a lot of foods that are high in solid fats, added sugars, or sodium. ?Maintain a healthy weight ?Body mass index (BMI) is a measurement that can be used to identify possible weight problems. It estimates body fat based on height and weight. Your health care provider can help determine your BMI and help you achieve or maintain a healthy weight. ?Get regular exercise ?Get regular exercise. This is one of the most important things you can do for your health. Most adults should: ?Exercise for at least 150 minutes each week. The exercise should increase your heart rate and make you sweat (moderate-intensity exercise). ?Do strengthening exercises at least twice a week. This is in addition to the moderate-intensity exercise. ?Spend less time sitting. Even light physical activity can be beneficial. ?Watch cholesterol and blood lipids ?Have your blood tested for lipids and cholesterol at 63 years of age, then have this test every 5 years. ?You may need to have your cholesterol levels checked more often if: ?Your lipid or cholesterol levels are  high. ?You are older than 63 years of age. ?You are at high risk for heart disease. ?What should I know about cancer screening? ?Many types of cancers can be detected early and may often be prevented. Depending on your health history and family history, you may need to have cancer screening at various ages. This may include screening for: ?Colorectal cancer. ?Prostate cancer. ?Skin cancer. ?Lung cancer. ?What should I know about heart disease, diabetes, and high blood pressure? ?Blood pressure and heart disease ?High blood pressure causes heart disease and increases the risk of stroke. This is more likely to develop in people who have high blood pressure readings or are overweight. ?Talk with your health care provider about your target blood pressure readings. ?Have your blood pressure checked: ?Every 3-5 years if you are 56-40 years of age. ?Every year if you are 12 years old or older. ?If you are between the ages of 42 and 45 and are a current or former smoker, ask your health care provider if you should have a one-time screening for abdominal aortic aneurysm (AAA). ?Diabetes ?Have regular diabetes screenings. This checks your fasting blood sugar level. Have the screening done: ?Once every three years after age 88 if you are at a normal weight and have a low risk for diabetes. ?More often and at a younger age if you are overweight or have a high risk for diabetes. ?What should I know about preventing infection? ?Hepatitis B ?If you have a higher risk for hepatitis B, you should be screened for this  virus. Talk with your health care provider to find out if you are at risk for hepatitis B infection. ?Hepatitis C ?Blood testing is recommended for: ?Everyone born from [birth year] through 1967. ?Anyone with known risk factors for hepatitis C. ?Sexually transmitted infections (STIs) ?You should be screened each year for STIs, including gonorrhea and chlamydia, if: ?You are sexually active and are younger than 63 years of  age. ?You are older than 63 years of age and your health care provider tells you that you are at risk for this type of infection. ?Your sexual activity has changed since you were last screened, and you are at increased risk for chlamydia or gonorrhea. Ask your health care provider if you are at risk. ?Ask your health care provider about whether you are at high risk for HIV. Your health care provider may recommend a prescription medicine to help prevent HIV infection. If you choose to take medicine to prevent HIV, you should first get tested for HIV. You should then be tested every 3 months for as long as you are taking the medicine. ?Follow these instructions at home: ?Alcohol use ?Do not drink alcohol if your health care provider tells you not to drink. ?If you drink alcohol: ?Limit how much you have to 0-2 drinks a day. ?Know how much alcohol is in your drink. In the [place] one drink equals one 12 oz bottle of beer (355 mL), one 5 oz glass of wine (148 mL), or one 1? oz glass of hard liquor (44 mL). ?Lifestyle ?Do not use any products that contain nicotine or tobacco. These products include cigarettes, chewing tobacco, and vaping devices, such as e-cigarettes. If you need help quitting, ask your health care provider. ?Do not use street drugs. ?Do not share needles. ?Ask your health care provider for help if you need support or information about quitting drugs. ?General instructions ?Schedule regular health, dental, and eye exams. ?Stay current with your vaccines. ?Tell your health care provider if: ?You often feel depressed. ?You have ever been abused or do not feel safe at home. ?Summary ?Adopting a healthy lifestyle and getting preventive care are important in promoting health and wellness. ?Follow your health care provider's instructions about healthy diet, exercising, and getting tested or screened for diseases. ?Follow your health care provider's instructions on monitoring your cholesterol and blood  pressure. ?This information is not intended to replace advice given to you by your health care provider. Make sure you discuss any questions you have with your health care provider. ?Document Revised: 03/19/2021 Document Reviewed: 03/19/2021 ?Elsevier Patient Education ? [place]. ? ?

## 2022-03-14 ENCOUNTER — Ambulatory Visit (INDEPENDENT_AMBULATORY_CARE_PROVIDER_SITE_OTHER): Payer: Managed Care, Other (non HMO) | Admitting: Internal Medicine

## 2022-03-14 VITALS — BP 122/78 | HR 73 | Temp 98.0°F | Ht 71.0 in | Wt 138.8 lb

## 2022-03-14 DIAGNOSIS — R911 Solitary pulmonary nodule: Secondary | ICD-10-CM

## 2022-03-14 DIAGNOSIS — E782 Mixed hyperlipidemia: Secondary | ICD-10-CM | POA: Diagnosis not present

## 2022-03-14 DIAGNOSIS — Z125 Encounter for screening for malignant neoplasm of prostate: Secondary | ICD-10-CM

## 2022-03-14 DIAGNOSIS — Z0000 Encounter for general adult medical examination without abnormal findings: Secondary | ICD-10-CM

## 2022-03-14 DIAGNOSIS — R931 Abnormal findings on diagnostic imaging of heart and coronary circulation: Secondary | ICD-10-CM | POA: Insufficient documentation

## 2022-03-14 DIAGNOSIS — R7303 Prediabetes: Secondary | ICD-10-CM

## 2022-03-14 NOTE — Assessment & Plan Note (Signed)
Lung nodule seen on previous chest x-ray ?CT of lungs was ordered last fall, but for unknown reason was not done ?CT of chest ordered to follow-up right lung nodule ?Overall low risk for lung cancer ?

## 2022-03-14 NOTE — Assessment & Plan Note (Addendum)
Chronic ?Regular exercise and healthy diet encouraged ?Lipids pending-did have blood work done previously-we will get results ?Continue lifestyle control ?

## 2022-03-14 NOTE — Assessment & Plan Note (Addendum)
Chronic ?A1c pending-done at work ?Low sugar / carb diet ?Stressed regular exercise ?

## 2022-03-15 ENCOUNTER — Encounter: Payer: Self-pay | Admitting: Internal Medicine

## 2022-03-22 ENCOUNTER — Encounter: Payer: Self-pay | Admitting: Internal Medicine

## 2022-04-30 ENCOUNTER — Encounter: Payer: Self-pay | Admitting: Internal Medicine

## 2022-05-01 ENCOUNTER — Inpatient Hospital Stay: Admission: RE | Admit: 2022-05-01 | Payer: Managed Care, Other (non HMO) | Source: Ambulatory Visit

## 2022-06-03 ENCOUNTER — Encounter: Payer: Self-pay | Admitting: Internal Medicine

## 2022-07-01 NOTE — Patient Instructions (Signed)
     Blood work was ordered.     Medications changes include :   none   A Ct scan of your abdomen and pelvis was ordered.     Someone from that office will call you to schedule an appointment.    A referral was ordered for nutrition.  They will call you to schedule an appointment.

## 2022-07-01 NOTE — Progress Notes (Unsigned)
    Subjective:    Patient ID: Jeremy Ellis, male    DOB: [birth year], 61 y.o.   MRN: [id number]      HPI Jeremy Ellis is here for No chief complaint on file.     Weight loss -    Wt Readings from Last 3 Encounters:  03/14/22 138 lb 12.8 oz (63 kg)  08/01/21 141 lb (64 kg)  07/04/21 141 lb (64 kg)      Medications and allergies reviewed with patient and updated if appropriate.  Current Outpatient Medications on File Prior to Visit  Medication Sig Dispense Refill   Acetaminophen 500 MG coapsule      bifidobacterium infantis (ALIGN) capsule Every other day     Cholecalciferol (VITAMIN D3) 400 UNITS CAPS Take by mouth daily.     ECHINACEA PO Take by mouth 2 (two) times daily as needed (immune support). Reported on 11/02/2015     Multiple Vitamin (MULTIVITAMIN) tablet Take 1 tablet by mouth daily.     OMEGA-3 FATTY ACIDS-VITAMIN E PO      Saw Palmetto 450 MG CAPS Take by mouth. 1 by mouth two times daily     vitamin C (ASCORBIC ACID) 500 MG tablet Take 500 mg by mouth daily.     No current facility-administered medications on file prior to visit.    Review of Systems     Objective:  There were no vitals filed for this visit. BP Readings from Last 3 Encounters:  03/14/22 122/78  08/01/21 122/82  07/04/21 90/60   Wt Readings from Last 3 Encounters:  03/14/22 138 lb 12.8 oz (63 kg)  08/01/21 141 lb (64 kg)  07/04/21 141 lb (64 kg)   There is no height or weight on file to calculate BMI.    Physical Exam         Assessment & Plan:    See Problem List for Assessment and Plan of chronic medical problems.

## 2022-07-02 ENCOUNTER — Ambulatory Visit: Payer: Managed Care, Other (non HMO) | Admitting: Internal Medicine

## 2022-07-02 ENCOUNTER — Encounter: Payer: Self-pay | Admitting: Internal Medicine

## 2022-07-02 VITALS — BP 112/74 | HR 82 | Temp 98.0°F | Ht 71.0 in | Wt 135.0 lb

## 2022-07-02 DIAGNOSIS — R7303 Prediabetes: Secondary | ICD-10-CM | POA: Diagnosis not present

## 2022-07-02 DIAGNOSIS — R634 Abnormal weight loss: Secondary | ICD-10-CM | POA: Diagnosis not present

## 2022-07-02 DIAGNOSIS — R911 Solitary pulmonary nodule: Secondary | ICD-10-CM

## 2022-07-02 NOTE — Assessment & Plan Note (Signed)
Subacute Has had some unintentional weight loss over the past couple of years He has had increased stress-his mother died approximately 3 years ago and work has been more stressful He is exercising less, but is very active.  Is he states he practically runs down the hallway at work He may be eating less than he was, but still eats 3 good meals a day and snacks Given the continued unintentional weight loss will evaluate further He is not experiencing any localizing concerning symptoms Blood work in the past has been normal Will recheck CBC, CMP, TSH, A1c CT of chest as above CT of the abdomen pelvis Discussed trying to do some calorie counting to see how many calories he is actually consuming Discussed that if he is still hungry after eating he needs to increase his portions and eat more, which he will try doing Will refer to nutrition

## 2022-07-02 NOTE — Assessment & Plan Note (Signed)
Chronic Check a1c Eating a Low sugar / carb diet

## 2022-07-02 NOTE — Assessment & Plan Note (Signed)
Lung nodule seen on previous chest x-ray CT scan was ordered last fall to evaluate further, but was not done He is low risk for cancer, but given that he is having unintentional weight loss will obtain a CT scan of the chest

## 2022-07-03 LAB — CBC WITH DIFFERENTIAL/PLATELET
Basophils Absolute: 0 10*3/uL (ref 0.0–0.2)
Basos: 1 %
EOS (ABSOLUTE): 0.3 10*3/uL (ref 0.0–0.4)
Eos: 7 %
Hematocrit: 42 % (ref 37.5–51.0)
Hemoglobin: 14.1 g/dL (ref 13.0–17.7)
Immature Grans (Abs): 0 10*3/uL (ref 0.0–0.1)
Immature Granulocytes: 0 %
Lymphocytes Absolute: 1.1 10*3/uL (ref 0.7–3.1)
Lymphs: 25 %
MCH: 33 pg (ref 26.6–33.0)
MCHC: 33.6 g/dL (ref 31.5–35.7)
MCV: 98 fL — ABNORMAL HIGH (ref 79–97)
Monocytes Absolute: 0.4 10*3/uL (ref 0.1–0.9)
Monocytes: 9 %
Neutrophils Absolute: 2.6 10*3/uL (ref 1.4–7.0)
Neutrophils: 58 %
Platelets: 164 10*3/uL (ref 150–450)
RBC: 4.27 x10E6/uL (ref 4.14–5.80)
RDW: 11.7 % (ref 11.6–15.4)
WBC: 4.5 10*3/uL (ref 3.4–10.8)

## 2022-07-03 LAB — COMPREHENSIVE METABOLIC PANEL
ALT: 14 IU/L (ref 0–44)
AST: 22 IU/L (ref 0–40)
Albumin/Globulin Ratio: 1.9 (ref 1.2–2.2)
Albumin: 4.4 g/dL (ref 3.9–4.9)
Alkaline Phosphatase: 65 IU/L (ref 44–121)
BUN/Creatinine Ratio: 16 (ref 10–24)
BUN: 17 mg/dL (ref 8–27)
Bilirubin Total: 0.4 mg/dL (ref 0.0–1.2)
CO2: 24 mmol/L (ref 20–29)
Calcium: 9.7 mg/dL (ref 8.6–10.2)
Chloride: 103 mmol/L (ref 96–106)
Creatinine, Ser: 1.04 mg/dL (ref 0.76–1.27)
Globulin, Total: 2.3 g/dL (ref 1.5–4.5)
Glucose: 98 mg/dL (ref 70–99)
Potassium: 4.5 mmol/L (ref 3.5–5.2)
Sodium: 141 mmol/L (ref 134–144)
Total Protein: 6.7 g/dL (ref 6.0–8.5)
eGFR: 81 mL/min/{1.73_m2} (ref >59)

## 2022-07-03 LAB — HEMOGLOBIN A1C
Est. average glucose Bld gHb Est-mCnc: 120 mg/dL
Hgb A1c MFr Bld: 5.8 % — ABNORMAL HIGH (ref 4.8–5.6)

## 2022-07-03 LAB — TSH: TSH: 2.84 u[IU]/mL (ref 0.450–4.500)

## 2022-07-04 ENCOUNTER — Ambulatory Visit: Payer: Managed Care, Other (non HMO) | Admitting: Internal Medicine

## 2022-07-08 ENCOUNTER — Ambulatory Visit: Payer: Managed Care, Other (non HMO) | Admitting: Internal Medicine

## 2022-07-09 ENCOUNTER — Encounter: Payer: Self-pay | Admitting: Internal Medicine

## 2022-07-23 ENCOUNTER — Other Ambulatory Visit: Payer: Managed Care, Other (non HMO)

## 2022-07-23 ENCOUNTER — Inpatient Hospital Stay: Admission: RE | Admit: 2022-07-23 | Payer: Managed Care, Other (non HMO) | Source: Ambulatory Visit

## 2022-07-25 ENCOUNTER — Encounter: Payer: Self-pay | Admitting: Internal Medicine

## 2022-07-31 ENCOUNTER — Encounter: Payer: Managed Care, Other (non HMO) | Admitting: Dietician

## 2022-08-07 ENCOUNTER — Ambulatory Visit
Admission: RE | Admit: 2022-08-07 | Discharge: 2022-08-07 | Disposition: A | Discharge status: Home / Self Care | Payer: Managed Care, Other (non HMO) | Source: Ambulatory Visit | Attending: Internal Medicine | Admitting: Internal Medicine

## 2022-08-07 DIAGNOSIS — R911 Solitary pulmonary nodule: Secondary | ICD-10-CM

## 2022-08-09 ENCOUNTER — Encounter: Payer: Self-pay | Admitting: Internal Medicine

## 2022-08-12 ENCOUNTER — Encounter: Payer: Self-pay | Admitting: Internal Medicine

## 2022-08-20 ENCOUNTER — Encounter: Payer: Self-pay | Admitting: Internal Medicine

## 2022-09-04 ENCOUNTER — Encounter: Payer: Self-pay | Admitting: Internal Medicine

## 2022-09-12 ENCOUNTER — Encounter: Payer: Self-pay | Admitting: Internal Medicine

## 2022-09-18 ENCOUNTER — Ambulatory Visit: Payer: Managed Care, Other (non HMO) | Admitting: Dietician

## 2022-10-15 ENCOUNTER — Encounter: Payer: Self-pay | Admitting: Internal Medicine

## 2022-10-24 ENCOUNTER — Encounter: Payer: Self-pay | Admitting: Internal Medicine

## 2022-11-12 ENCOUNTER — Ambulatory Visit: Payer: Managed Care, Other (non HMO) | Admitting: Internal Medicine

## 2022-11-20 ENCOUNTER — Encounter: Payer: Self-pay | Admitting: Internal Medicine

## 2022-12-09 ENCOUNTER — Encounter: Payer: Self-pay | Admitting: Internal Medicine

## 2022-12-15 ENCOUNTER — Encounter: Payer: Self-pay | Admitting: Internal Medicine

## 2022-12-15 NOTE — Progress Notes (Unsigned)
    Subjective:    Patient ID: Jeremy Ellis, male    DOB: 10-06-1961, 62 y.o.   MRN: [id number]      HPI Jeremy Ellis is here for No chief complaint on file.    Dizziness -     Medications and allergies reviewed with patient and updated if appropriate.  Current Outpatient Medications on File Prior to Visit  Medication Sig Dispense Refill   Acetaminophen 500 MG coapsule      bifidobacterium infantis (ALIGN) capsule Patient takes one tab two times a month     Cholecalciferol (VITAMIN D3) 400 UNITS CAPS Take by mouth daily.     ECHINACEA PO Take by mouth 2 (two) times daily as needed (immune support). Reported on 11/02/2015     Multiple Vitamin (MULTIVITAMIN) tablet Take 1 tablet by mouth daily.     OMEGA-3 FATTY ACIDS-VITAMIN E PO      Saw Palmetto 450 MG CAPS Take by mouth. 1 by mouth two times daily     vitamin C (ASCORBIC ACID) 500 MG tablet Take 500 mg by mouth daily.     No current facility-administered medications on file prior to visit.    Review of Systems     Objective:  There were no vitals filed for this visit. BP Readings from Last 3 Encounters:  07/02/22 112/74  03/14/22 122/78  08/01/21 122/82   Wt Readings from Last 3 Encounters:  07/02/22 135 lb (61.2 kg)  03/14/22 138 lb 12.8 oz (63 kg)  08/01/21 141 lb (64 kg)   There is no height or weight on file to calculate BMI.    Physical Exam         Assessment & Plan:    See Problem List for Assessment and Plan of chronic medical problems.

## 2022-12-15 NOTE — Patient Instructions (Signed)
      Blood work was ordered.   The lab is on the first floor.    Medications changes include :       A referral was ordered for XXX.     Someone will call you to schedule an appointment.    No follow-ups on file.  

## 2022-12-16 ENCOUNTER — Ambulatory Visit: Payer: Managed Care, Other (non HMO) | Admitting: Internal Medicine

## 2022-12-16 ENCOUNTER — Telehealth: Payer: Managed Care, Other (non HMO) | Admitting: Physician Assistant

## 2022-12-16 DIAGNOSIS — R42 Dizziness and giddiness: Secondary | ICD-10-CM

## 2022-12-16 DIAGNOSIS — U071 COVID-19: Secondary | ICD-10-CM | POA: Diagnosis not present

## 2022-12-16 DIAGNOSIS — K219 Gastro-esophageal reflux disease without esophagitis: Secondary | ICD-10-CM

## 2022-12-16 MED ORDER — PROMETHAZINE HCL 25 MG RE SUPP: 25.0000 mg | Freq: Three times a day (TID) | RECTAL | 0 refills | Status: DC | PRN

## 2022-12-16 NOTE — Progress Notes (Signed)
Virtual Visit via Video Note  I connected with Jeremy Ellis on 12/16/22 at  3:20 PM EST by a video enabled telemedicine application and verified that I am speaking with the correct person using two identifiers.   I discussed the limitations of evaluation and management by telemedicine and the availability of in person appointments. The patient expressed understanding and agreed to proceed.  Present for the visit:  Myself, Dr Billey Gosling, Lacretia Leigh.  The patient is currently at home and I am in the office.    No referring provider.    History of Present Illness: This is an acute visit for covid, dizziness.   Sunday one eye felt like had a rock in it.  Took claritin.  Had onion with his lunch and had gerd, bloating.  Then felt hot and had a low grade fever.  Did not feel well.  He tested positive for covid.    Today is day 2 of symptoms.  Taking tylenol.  Doing BRAT diet.  Today has headaches, which the Tylenol is helping.   Still has spinning sensation - very transient.  This is not a new problem and has been occurring intermittently.  Sometimes related to head movements.  There is no change in hearing and no ear pain.   Gas, bloating and last night had gerd.  He takes famotidine as needed. Taking it more frequently - taking it 1-2 times a week.     Review of Systems  Constitutional:  Positive for fever (low grade) and malaise/fatigue.       Dec appetite  HENT:  Positive for congestion and sore throat.   Respiratory:  Positive for cough. Negative for shortness of breath and wheezing.   Gastrointestinal:  Positive for heartburn and nausea.  Musculoskeletal:  Positive for myalgias.  Neurological:  Positive for dizziness and headaches.      Social History   Socioeconomic History   Marital status: Single    Spouse name: Not on file   Number of children: Not on file   Years of education: Not on file   Highest education level: Not on file  Occupational History    Occupation: Scientist  Tobacco Use   Smoking status: Never   Smokeless tobacco: Never  Substance and Sexual Activity   Alcohol use: Yes    Alcohol/week: 1.0 standard drink of alcohol    Types: 1 Glasses of wine per week    Comment: Red wine 3-4x monthly     Drug use: No   Sexual activity: Not on file  Other Topics Concern   Not on file  Social History Narrative   Exercise - 5 days a week - cardio for 30-40 minutes   Social Determinants of Health   Financial Resource Strain: Not on file  Food Insecurity: Not on file  Transportation Needs: Not on file  Physical Activity: Not on file  Stress: Not on file  Social Connections: Not on file     Observations/Objective: Appears well in NAD Breathing normally  Assessment and Plan:  See Problem List for Assessment and Plan of chronic medical problems.   Follow Up Instructions:    I discussed the assessment and treatment plan with the patient. The patient was provided an opportunity to ask questions and all were answered. The patient agreed with the plan and demonstrated an understanding of the instructions.   The patient was advised to call back or seek an in-person evaluation if the symptoms worsen or if the condition fails  to improve as anticipated.    Binnie Rail, MD

## 2022-12-16 NOTE — Assessment & Plan Note (Signed)
Chronic Intermittent-not too often Transient spinning sensation Sometimes clearly occurs with head movements, other times it does not Likely BPPV Referral for vestibular PT

## 2022-12-16 NOTE — Assessment & Plan Note (Signed)
Chronic Intermittent Often triggered by certain just tomatoes, onions Taking OTC famotidine 20 mg daily as needed-has been taking it more often, but still only takes it 1-2 times a week Discussed that he may need to avoid those foods that trigger his symptoms more may need to take famotidine on a daily basis Already taking a probiotic At times he does become very nauseous and wondered about an antinausea medication-I did send Phenergan suppositories to his pharmacy that he can use as needed, but reminded him that this is likely a symptom of the reflux stomach upset and should treat it with the famotidine as well

## 2022-12-16 NOTE — Assessment & Plan Note (Signed)
Acute Today is day 2 of symptoms Symptoms mild in nature Discussed Paxlovid, but he declined a prescription for this at this time Continue symptomatic treatment with Tylenol, over-the-counter cold medications, rest, fluids Call with any questions or if symptoms are not improving Discussed quarantine recommendations

## 2022-12-16 NOTE — Progress Notes (Signed)
Question asked to patient if E-visit questionnaire was completed in error as it appears he also had a visit with his PCP today. No response. Visit closed, marked no charge.

## 2022-12-19 ENCOUNTER — Encounter: Payer: Self-pay | Admitting: Internal Medicine

## 2022-12-19 DIAGNOSIS — R42 Dizziness and giddiness: Secondary | ICD-10-CM

## 2022-12-19 DIAGNOSIS — E782 Mixed hyperlipidemia: Secondary | ICD-10-CM

## 2022-12-19 DIAGNOSIS — R7303 Prediabetes: Secondary | ICD-10-CM

## 2022-12-19 DIAGNOSIS — Z125 Encounter for screening for malignant neoplasm of prostate: Secondary | ICD-10-CM

## 2022-12-19 DIAGNOSIS — Z0000 Encounter for general adult medical examination without abnormal findings: Secondary | ICD-10-CM

## 2022-12-23 ENCOUNTER — Encounter: Payer: Self-pay | Admitting: Internal Medicine

## 2022-12-25 ENCOUNTER — Encounter: Payer: Self-pay | Admitting: Internal Medicine

## 2022-12-26 ENCOUNTER — Encounter: Payer: Self-pay | Admitting: Internal Medicine

## 2022-12-26 DIAGNOSIS — R7303 Prediabetes: Secondary | ICD-10-CM

## 2023-03-04 ENCOUNTER — Encounter: Payer: Self-pay | Admitting: Internal Medicine

## 2023-05-05 ENCOUNTER — Encounter: Payer: Self-pay | Admitting: Internal Medicine

## 2023-05-05 NOTE — Progress Notes (Unsigned)
Subjective:    Patient ID: Jeremy Ellis, male    DOB: November 17, 1960, 62 y.o.   MRN: [id number]     HPI Jeremy Ellis is here for a physical exam and his chronic medical problems.   He has added his power walks for 45 minutes and has lost more weight.     Medications and allergies reviewed with patient and updated if appropriate.  Current Outpatient Medications on File Prior to Visit  Medication Sig Dispense Refill   Acetaminophen 500 MG coapsule      bifidobacterium infantis (ALIGN) capsule Patient takes one tab two times a month     Cholecalciferol (VITAMIN D3) 400 UNITS CAPS Take by mouth daily.     ECHINACEA PO Take by mouth 2 (two) times daily as needed (immune support). Reported on 11/02/2015     famotidine (PEPCID) 20 MG tablet Take 20 mg by mouth daily as needed for heartburn or indigestion.     Multiple Vitamin (MULTIVITAMIN) tablet Take 1 tablet by mouth daily.     OMEGA-3 FATTY ACIDS-VITAMIN E PO      promethazine (PHENERGAN) 25 MG suppository Place 1 suppository (25 mg total) rectally every 8 (eight) hours as needed for nausea or vomiting. 12 each 0   Saw Palmetto 450 MG CAPS Take by mouth. 1 by mouth two times daily     vitamin C (ASCORBIC ACID) 500 MG tablet Take 500 mg by mouth daily.     No current facility-administered medications on file prior to visit.    Review of Systems  Constitutional:  Negative for fever.  Eyes:  Negative for visual disturbance.  Respiratory:  Positive for cough (occ). Negative for shortness of breath and wheezing.   Cardiovascular:  Negative for chest pain, palpitations and leg swelling.  Gastrointestinal:  Negative for abdominal pain, blood in stool, constipation and diarrhea.       Occ gerd  Genitourinary:  Negative for difficulty urinating, dysuria and hematuria.  Musculoskeletal:  Negative for arthralgias and back pain.  Skin:  Negative for rash.  Neurological:  Positive for light-headedness (occ when standing) and headaches  (occ).  Psychiatric/Behavioral:  Negative for dysphoric mood. The patient is not nervous/anxious.        Objective:   Vitals:   05/06/23 0822  BP: 118/70  Pulse: 89  Temp: 98 F (36.7 C)  SpO2: 97%   Filed Weights   05/06/23 0822  Weight: 132 lb (59.9 kg)   Body mass index is 18.41 kg/m.  BP Readings from Last 3 Encounters:  05/06/23 118/70  07/02/22 112/74  03/14/22 122/78    Wt Readings from Last 3 Encounters:  05/06/23 132 lb (59.9 kg)  07/02/22 135 lb (61.2 kg)  03/14/22 138 lb 12.8 oz (63 kg)      Physical Exam Constitutional: He appears well-developed and well-nourished. No distress.  HENT:  Head: Normocephalic and atraumatic.  Right Ear: External ear normal.  Left Ear: External ear normal.  Normal ear canals and TM b/l  Mouth/Throat: Oropharynx is clear and moist. Eyes: Conjunctivae and EOM are normal.  Neck: Neck supple. No tracheal deviation present. No thyromegaly present.  No carotid bruit  Cardiovascular: Normal rate, regular rhythm, normal heart sounds and intact distal pulses.   No murmur heard.  No lower extremity edema. Pulmonary/Chest: Effort normal and breath sounds normal. No respiratory distress. He has no wheezes. He has no rales.  Abdominal: Soft. He exhibits no distension. There is no tenderness.  Genitourinary: deferred  Lymphadenopathy:  He has no cervical adenopathy.  Skin: Skin is warm and dry. He is not diaphoretic.  Psychiatric: He has a normal mood and affect. His behavior is normal.         Assessment & Plan:   Physical exam: Screening blood work  ordered Exercise   regular - walking, some weights, very active Weight  normal - on low side - discussed increasing calorie intake in a healthy way Substance abuse   none   Reviewed recommended immunizations.   Health Maintenance  Topic Date Due   COVID-19 Vaccine (6 - 2023-24 season) 05/22/2023 (Originally 12/19/2022)   INFLUENZA VACCINE  06/12/2023   Colonoscopy   11/28/2029   DTaP/Tdap/Td (4 - Td or Tdap) 11/22/2031   Hepatitis C Screening  Completed   HIV Screening  Completed   Zoster Vaccines- Shingrix  Completed   HPV VACCINES  Aged Out     See Problem List for Assessment and Plan of chronic medical problems.

## 2023-05-05 NOTE — Patient Instructions (Addendum)
Blood work was ordered.   The lab is on the first floor.    Medications changes include :   none     Return in about 6 months (around 11/05/2023) for follow up.   Health Maintenance, Male Adopting a healthy lifestyle and getting preventive care are important in promoting health and wellness. Ask your health care provider about: The right schedule for you to have regular tests and exams. Things you can do on your own to prevent diseases and keep yourself healthy. What should I know about diet, weight, and exercise? Eat a healthy diet  Eat a diet that includes plenty of vegetables, fruits, low-fat dairy products, and lean protein. Do not eat a lot of foods that are high in solid fats, added sugars, or sodium. Maintain a healthy weight Body mass index (BMI) is a measurement that can be used to identify possible weight problems. It estimates body fat based on height and weight. Your health care provider can help determine your BMI and help you achieve or maintain a healthy weight. Get regular exercise Get regular exercise. This is one of the most important things you can do for your health. Most adults should: Exercise for at least 150 minutes each week. The exercise should increase your heart rate and make you sweat (moderate-intensity exercise). Do strengthening exercises at least twice a week. This is in addition to the moderate-intensity exercise. Spend less time sitting. Even light physical activity can be beneficial. Watch cholesterol and blood lipids Have your blood tested for lipids and cholesterol at 64 years of age, then have this test every 5 years. You may need to have your cholesterol levels checked more often if: Your lipid or cholesterol levels are high. You are older than 64 years of age. You are at high risk for heart disease. What should I know about cancer screening? Many types of cancers can be detected early and may often be prevented. Depending on your  health history and family history, you may need to have cancer screening at various ages. This may include screening for: Colorectal cancer. Prostate cancer. Skin cancer. Lung cancer. What should I know about heart disease, diabetes, and high blood pressure? Blood pressure and heart disease High blood pressure causes heart disease and increases the risk of stroke. This is more likely to develop in people who have high blood pressure readings or are overweight. Talk with your health care provider about your target blood pressure readings. Have your blood pressure checked: Every 3-5 years if you are 28-66 years of age. Every year if you are 31 years old or older. If you are between the ages of 30 and 67 and are a current or former smoker, ask your health care provider if you should have a one-time screening for abdominal aortic aneurysm (AAA). Diabetes Have regular diabetes screenings. This checks your fasting blood sugar level. Have the screening done: Once every three years after age 30 if you are at a normal weight and have a low risk for diabetes. More often and at a younger age if you are overweight or have a high risk for diabetes. What should I know about preventing infection? Hepatitis B If you have a higher risk for hepatitis B, you should be screened for this virus. Talk with your health care provider to find out if you are at risk for hepatitis B infection. Hepatitis C Blood testing is recommended for: Everyone born from [birth year] through 1965. Anyone with known risk  factors for hepatitis C. Sexually transmitted infections (STIs) You should be screened each year for STIs, including gonorrhea and chlamydia, if: You are sexually active and are younger than 64 years of age. You are older than 64 years of age and your health care provider tells you that you are at risk for this type of infection. Your sexual activity has changed since you were last screened, and you are at increased risk  for chlamydia or gonorrhea. Ask your health care provider if you are at risk. Ask your health care provider about whether you are at high risk for HIV. Your health care provider may recommend a prescription medicine to help prevent HIV infection. If you choose to take medicine to prevent HIV, you should first get tested for HIV. You should then be tested every 3 months for as long as you are taking the medicine. Follow these instructions at home: Alcohol use Do not drink alcohol if your health care provider tells you not to drink. If you drink alcohol: Limit how much you have to 0-2 drinks a day. Know how much alcohol is in your drink. In the [place] one drink equals one 12 oz bottle of beer (355 mL), one 5 oz glass of wine (148 mL), or one 1 oz glass of hard liquor (44 mL). Lifestyle Do not use any products that contain nicotine or tobacco. These products include cigarettes, chewing tobacco, and vaping devices, such as e-cigarettes. If you need help quitting, ask your health care provider. Do not use street drugs. Do not share needles. Ask your health care provider for help if you need support or information about quitting drugs. General instructions Schedule regular health, dental, and eye exams. Stay current with your vaccines. Tell your health care provider if: You often feel depressed. You have ever been abused or do not feel safe at home. Summary Adopting a healthy lifestyle and getting preventive care are important in promoting health and wellness. Follow your health care provider's instructions about healthy diet, exercising, and getting tested or screened for diseases. Follow your health care provider's instructions on monitoring your cholesterol and blood pressure. This information is not intended to replace advice given to you by your health care provider. Make sure you discuss any questions you have with your health care provider. Document Revised: 03/19/2021 Document Reviewed:  03/19/2021 Elsevier Patient Education  2024 [employer].

## 2023-05-06 ENCOUNTER — Ambulatory Visit (INDEPENDENT_AMBULATORY_CARE_PROVIDER_SITE_OTHER): Payer: Managed Care, Other (non HMO) | Admitting: Internal Medicine

## 2023-05-06 VITALS — BP 118/70 | HR 89 | Temp 98.0°F | Ht 71.0 in | Wt 132.0 lb

## 2023-05-06 DIAGNOSIS — K219 Gastro-esophageal reflux disease without esophagitis: Secondary | ICD-10-CM

## 2023-05-06 DIAGNOSIS — Z125 Encounter for screening for malignant neoplasm of prostate: Secondary | ICD-10-CM | POA: Diagnosis not present

## 2023-05-06 DIAGNOSIS — E782 Mixed hyperlipidemia: Secondary | ICD-10-CM | POA: Diagnosis not present

## 2023-05-06 DIAGNOSIS — R7303 Prediabetes: Secondary | ICD-10-CM

## 2023-05-06 DIAGNOSIS — Z0000 Encounter for general adult medical examination without abnormal findings: Secondary | ICD-10-CM

## 2023-05-06 NOTE — Assessment & Plan Note (Signed)
Chronic GERD controlled Continue pepcid 20 mg daily prn - takes about 2/week

## 2023-05-06 NOTE — Assessment & Plan Note (Signed)
Chronic Regular exercise and healthy diet encouraged Check lipid panel, CMP, CBC, TSH Continue lifestyle control 

## 2023-05-06 NOTE — Assessment & Plan Note (Signed)
Chronic Family history of diabetes Check a1c Eating a Low sugar / carb diet Continue regular exercise

## 2023-05-07 LAB — COMPREHENSIVE METABOLIC PANEL
ALT: 22 IU/L (ref 0–44)
AST: 27 IU/L (ref 0–40)
Albumin: 4.5 g/dL (ref 3.9–4.9)
Alkaline Phosphatase: 67 IU/L (ref 44–121)
BUN/Creatinine Ratio: 21 (ref 10–24)
BUN: 22 mg/dL (ref 8–27)
Bilirubin Total: 0.5 mg/dL (ref 0.0–1.2)
CO2: 25 mmol/L (ref 20–29)
Calcium: 9.5 mg/dL (ref 8.6–10.2)
Chloride: 103 mmol/L (ref 96–106)
Creatinine, Ser: 1.07 mg/dL (ref 0.76–1.27)
Globulin, Total: 1.8 g/dL (ref 1.5–4.5)
Glucose: 96 mg/dL (ref 70–99)
Potassium: 4.6 mmol/L (ref 3.5–5.2)
Sodium: 139 mmol/L (ref 134–144)
Total Protein: 6.3 g/dL (ref 6.0–8.5)
eGFR: 77 mL/min/{1.73_m2} (ref >59)

## 2023-05-07 LAB — LIPID PANEL
Chol/HDL Ratio: 2.8 ratio (ref 0.0–5.0)
Cholesterol, Total: 155 mg/dL (ref 100–199)
HDL: 56 mg/dL (ref >39)
LDL Chol Calc (NIH): 89 mg/dL (ref 0–99)
Triglycerides: 47 mg/dL (ref 0–149)
VLDL Cholesterol Cal: 10 mg/dL (ref 5–40)

## 2023-05-07 LAB — HEMOGLOBIN A1C
Est. average glucose Bld gHb Est-mCnc: 123 mg/dL
Hgb A1c MFr Bld: 5.9 % — ABNORMAL HIGH (ref 4.8–5.6)

## 2023-05-07 LAB — CBC WITH DIFFERENTIAL/PLATELET
Basophils Absolute: 0 10*3/uL (ref 0.0–0.2)
Basos: 1 %
EOS (ABSOLUTE): 0.3 10*3/uL (ref 0.0–0.4)
Eos: 7 %
Hematocrit: 42.5 % (ref 37.5–51.0)
Hemoglobin: 14.4 g/dL (ref 13.0–17.7)
Immature Grans (Abs): 0 10*3/uL (ref 0.0–0.1)
Immature Granulocytes: 0 %
Lymphocytes Absolute: 1 10*3/uL (ref 0.7–3.1)
Lymphs: 24 %
MCH: 33.6 pg — ABNORMAL HIGH (ref 26.6–33.0)
MCHC: 33.9 g/dL (ref 31.5–35.7)
MCV: 99 fL — ABNORMAL HIGH (ref 79–97)
Monocytes Absolute: 0.4 10*3/uL (ref 0.1–0.9)
Monocytes: 10 %
Neutrophils Absolute: 2.3 10*3/uL (ref 1.4–7.0)
Neutrophils: 58 %
Platelets: 188 10*3/uL (ref 150–450)
RBC: 4.29 x10E6/uL (ref 4.14–5.80)
RDW: 11.7 % (ref 11.6–15.4)
WBC: 3.9 10*3/uL (ref 3.4–10.8)

## 2023-05-07 LAB — TSH: TSH: 2.58 u[IU]/mL (ref 0.450–4.500)

## 2023-05-07 LAB — PSA: Prostate Specific Ag, Serum: 0.7 ng/mL (ref 0.0–4.0)

## 2023-06-05 ENCOUNTER — Encounter: Payer: Self-pay | Admitting: Internal Medicine

## 2023-06-05 LAB — LAB REPORT - SCANNED: A1c: 5.8

## 2023-09-15 ENCOUNTER — Encounter: Payer: Self-pay | Admitting: Internal Medicine

## 2024-01-02 ENCOUNTER — Encounter: Payer: Self-pay | Admitting: Internal Medicine

## 2024-01-02 ENCOUNTER — Telehealth: Payer: Self-pay | Admitting: Internal Medicine

## 2024-01-02 NOTE — Telephone Encounter (Signed)
Copied from CRM [id number]. Topic: Clinical - Request for Lab/Test Order >> Jan 02, 2024 12:24 PM Denese Killings wrote: Reason for CRM: Patient wants to know if he can get an order to schedule lab for glucose level to a [facility] near his office in [place].

## 2024-01-02 NOTE — Telephone Encounter (Signed)
Mychart sent to patient that he is due for his 6 month follow up after cancelling appointment and he should plan to come in first prior to lab work being ordered.

## 2024-01-05 ENCOUNTER — Ambulatory Visit: Payer: Managed Care, Other (non HMO) | Admitting: Internal Medicine

## 2024-01-21 ENCOUNTER — Ambulatory Visit: Admitting: Internal Medicine

## 2024-01-27 DIAGNOSIS — R5383 Other fatigue: Secondary | ICD-10-CM | POA: Insufficient documentation

## 2024-01-27 NOTE — Progress Notes (Unsigned)
      Subjective:    Patient ID: Jeremy Ellis, male    DOB: Jul 12, 1961, 63 y.o.   MRN: [id number]     HPI Kevin is here for follow up of his chronic medical problems.  Fatigue  Weight -   Medications and allergies reviewed with patient and updated if appropriate.  Current Outpatient Medications on File Prior to Visit  Medication Sig Dispense Refill   Acetaminophen 500 MG coapsule      bifidobacterium infantis (ALIGN) capsule Patient takes one tab two times a month     Cholecalciferol (VITAMIN D3) 400 UNITS CAPS Take by mouth daily.     ECHINACEA PO Take by mouth 2 (two) times daily as needed (immune support). Reported on 11/02/2015     famotidine (PEPCID) 20 MG tablet Take 20 mg by mouth daily as needed for heartburn or indigestion.     Multiple Vitamin (MULTIVITAMIN) tablet Take 1 tablet by mouth daily.     OMEGA-3 FATTY ACIDS-VITAMIN E PO      promethazine (PHENERGAN) 25 MG suppository Place 1 suppository (25 mg total) rectally every 8 (eight) hours as needed for nausea or vomiting. 12 each 0   Saw Palmetto 450 MG CAPS Take by mouth. 1 by mouth two times daily     vitamin C (ASCORBIC ACID) 500 MG tablet Take 500 mg by mouth daily.     No current facility-administered medications on file prior to visit.     Review of Systems     Objective:  There were no vitals filed for this visit. BP Readings from Last 3 Encounters:  05/06/23 118/70  07/02/22 112/74  03/14/22 122/78   Wt Readings from Last 3 Encounters:  05/06/23 132 lb (59.9 kg)  07/02/22 135 lb (61.2 kg)  03/14/22 138 lb 12.8 oz (63 kg)   There is no height or weight on file to calculate BMI.    Physical Exam     Lab Results  Component Value Date   WBC 3.9 05/06/2023   HGB 14.4 05/06/2023   HCT 42.5 05/06/2023   PLT 188 05/06/2023   GLUCOSE 96 05/06/2023   CHOL 155 05/06/2023   TRIG 47 05/06/2023   HDL 56 05/06/2023   LDLCALC 89 05/06/2023   ALT 22 05/06/2023   AST 27 05/06/2023   NA  139 05/06/2023   K 4.6 05/06/2023   CL 103 05/06/2023   CREATININE 1.07 05/06/2023   BUN 22 05/06/2023   CO2 25 05/06/2023   TSH 2.580 05/06/2023   PSA 0.29 03/03/2018   INR 1.1 ratio (H) 10/31/2009   HGBA1C 5.9 (H) 05/06/2023     Assessment & Plan:    See Problem List for Assessment and Plan of chronic medical problems.

## 2024-01-28 ENCOUNTER — Ambulatory Visit: Admitting: Internal Medicine

## 2024-01-28 VITALS — BP 112/78 | HR 61 | Temp 98.1°F | Ht 71.0 in | Wt 136.0 lb

## 2024-01-28 DIAGNOSIS — K219 Gastro-esophageal reflux disease without esophagitis: Secondary | ICD-10-CM

## 2024-01-28 DIAGNOSIS — E782 Mixed hyperlipidemia: Secondary | ICD-10-CM

## 2024-01-28 DIAGNOSIS — R5383 Other fatigue: Secondary | ICD-10-CM | POA: Diagnosis not present

## 2024-01-28 DIAGNOSIS — R7303 Prediabetes: Secondary | ICD-10-CM | POA: Diagnosis not present

## 2024-01-28 DIAGNOSIS — R636 Underweight: Secondary | ICD-10-CM | POA: Insufficient documentation

## 2024-01-28 LAB — CBC WITH DIFFERENTIAL/PLATELET
Basophils Absolute: 0 10*3/uL (ref 0.0–0.1)
Basophils Relative: 0.6 % (ref 0.0–3.0)
Eosinophils Absolute: 0.5 10*3/uL (ref 0.0–0.7)
Eosinophils Relative: 10.5 % — ABNORMAL HIGH (ref 0.0–5.0)
HCT: 41.6 % (ref 39.0–52.0)
Hemoglobin: 14.3 g/dL (ref 13.0–17.0)
Lymphocytes Relative: 20.4 % (ref 12.0–46.0)
Lymphs Abs: 1 10*3/uL (ref 0.7–4.0)
MCHC: 34.4 g/dL (ref 30.0–36.0)
MCV: 98.5 fl (ref 78.0–100.0)
Monocytes Absolute: 0.5 10*3/uL (ref 0.1–1.0)
Monocytes Relative: 11.5 % (ref 3.0–12.0)
Neutro Abs: 2.7 10*3/uL (ref 1.4–7.7)
Neutrophils Relative %: 57 % (ref 43.0–77.0)
Platelets: 170 10*3/uL (ref 150.0–400.0)
RBC: 4.23 Mil/uL (ref 4.22–5.81)
RDW: 12.6 % (ref 11.5–15.5)
WBC: 4.7 10*3/uL (ref 4.0–10.5)

## 2024-01-28 LAB — COMPREHENSIVE METABOLIC PANEL
ALT: 18 U/L (ref 0–53)
AST: 24 U/L (ref 0–37)
Albumin: 4.5 g/dL (ref 3.5–5.2)
Alkaline Phosphatase: 53 U/L (ref 39–117)
BUN: 26 mg/dL — ABNORMAL HIGH (ref 6–23)
CO2: 31 meq/L (ref 19–32)
Calcium: 9.7 mg/dL (ref 8.4–10.5)
Chloride: 102 meq/L (ref 96–112)
Creatinine, Ser: 1.02 mg/dL (ref 0.40–1.50)
GFR: 77.48 mL/min (ref >60.00)
Glucose, Bld: 95 mg/dL (ref 70–99)
Potassium: 4.9 meq/L (ref 3.5–5.1)
Sodium: 137 meq/L (ref 135–145)
Total Bilirubin: 0.6 mg/dL (ref 0.2–1.2)
Total Protein: 7 g/dL (ref 6.0–8.3)

## 2024-01-28 LAB — LIPID PANEL
Cholesterol: 153 mg/dL (ref 0–200)
HDL: 55.2 mg/dL (ref >39.00)
LDL Cholesterol: 87 mg/dL (ref 0–99)
NonHDL: 97.48
Total CHOL/HDL Ratio: 3
Triglycerides: 51 mg/dL (ref 0.0–149.0)
VLDL: 10.2 mg/dL (ref 0.0–40.0)

## 2024-01-28 LAB — HEMOGLOBIN A1C: Hgb A1c MFr Bld: 6.1 % (ref 4.6–6.5)

## 2024-01-28 LAB — TSH: TSH: 2.69 u[IU]/mL (ref 0.35–5.50)

## 2024-01-28 NOTE — Assessment & Plan Note (Signed)
 Chronic Family history of diabetes Lab Results  Component Value Date   HGBA1C 5.9 (H) 05/06/2023   Check a1c Eating a Low sugar / carb diet Continue regular exercise

## 2024-01-28 NOTE — Assessment & Plan Note (Signed)
 Chronic Has chronically been low weight Under stressful time he did lose weight and was in the 150s and has not been able to gain weight back to that area He is eating very healthy and watching his sugars and fats He is very active and is doing some exercise Advised that if he wants to increase his weight he needs to add about 500 cal each day to his intake in order to gain 1 pound a week

## 2024-01-28 NOTE — Patient Instructions (Addendum)
      Blood work was ordered.       Medications changes include :   None      Return in about 6 months (around 07/30/2024) for Physical Exam.

## 2024-01-28 NOTE — Assessment & Plan Note (Signed)
Chronic Regular exercise and healthy diet encouraged Check lipid panel, CMP, CBC, TSH Continue lifestyle control 

## 2024-01-28 NOTE — Assessment & Plan Note (Signed)
 New States little fatigue On the weekends he will sometimes take a nap, but does not do that during the week.  He does feel refreshed afterwards Does not have any difficulty doing his work full-time or other activities after work Likely this is within normal limits-no concerns with taking a nap on the weekends Will be checking basic blood work including CBC, CMP, TSH

## 2024-01-28 NOTE — Assessment & Plan Note (Addendum)
 Chronic GERD controlled overall GERD is intermittent Continue pepcid 20 mg daily prn -has been taking more often and discussed that if he needs to take more often it is okay to take it

## 2024-01-29 ENCOUNTER — Encounter: Payer: Self-pay | Admitting: Internal Medicine

## 2024-02-09 ENCOUNTER — Encounter: Payer: Self-pay | Admitting: Internal Medicine

## 2024-03-25 ENCOUNTER — Encounter: Payer: Self-pay | Admitting: Internal Medicine

## 2024-05-27 LAB — LAB REPORT - SCANNED: A1c: 5.7

## 2024-05-31 ENCOUNTER — Encounter: Payer: Self-pay | Admitting: Internal Medicine

## 2024-06-02 ENCOUNTER — Encounter: Payer: Self-pay | Admitting: Internal Medicine

## 2024-06-02 NOTE — Progress Notes (Unsigned)
 Subjective:    Patient ID: Jeremy Ellis, male    DOB: 1961/07/09, 63 y.o.   MRN: [id number]      HPI Jeremy Ellis is here for No chief complaint on file.   He is experiencing dizziness, occasional lightheadedness, nasal congestion, PND.  Occasional metallic taste in mouth, burning sensation in chest which is relieved by famotidine .       Medications and allergies reviewed with patient and updated if appropriate.  Current Outpatient Medications on File Prior to Visit  Medication Sig Dispense Refill   Acetaminophen 500 MG coapsule      bifidobacterium infantis (ALIGN) capsule Patient takes one tab two times a month     Cholecalciferol (VITAMIN D3) 400 UNITS CAPS Take by mouth daily.     ECHINACEA PO Take by mouth 2 (two) times daily as needed (immune support). Reported on 11/02/2015     famotidine  (PEPCID ) 20 MG tablet Take 20 mg by mouth daily as needed for heartburn or indigestion.     Multiple Vitamin (MULTIVITAMIN) tablet Take 1 tablet by mouth daily.     OMEGA-3 FATTY ACIDS-VITAMIN E PO      Saw Palmetto 450 MG CAPS Take by mouth. 1 by mouth two times daily     vitamin C (ASCORBIC ACID) 500 MG tablet Take 500 mg by mouth daily.     No current facility-administered medications on file prior to visit.    Review of Systems     Objective:  There were no vitals filed for this visit. BP Readings from Last 3 Encounters:  01/28/24 112/78  05/06/23 118/70  07/02/22 112/74   Wt Readings from Last 3 Encounters:  01/28/24 136 lb (61.7 kg)  05/06/23 132 lb (59.9 kg)  07/02/22 135 lb (61.2 kg)   There is no height or weight on file to calculate BMI.    Physical Exam Constitutional:      General: He is not in acute distress.    Appearance: Normal appearance. He is not ill-appearing.  HENT:     Head: Normocephalic and atraumatic.     Right Ear: Tympanic membrane, ear canal and external ear normal. There is no impacted cerumen.     Left Ear: Tympanic membrane, ear  canal and external ear normal. There is no impacted cerumen.     Mouth/Throat:     Mouth: Mucous membranes are moist.     Pharynx: No oropharyngeal exudate or posterior oropharyngeal erythema.  Eyes:     Conjunctiva/sclera: Conjunctivae normal.  Cardiovascular:     Rate and Rhythm: Normal rate and regular rhythm.     Heart sounds: Normal heart sounds.  Pulmonary:     Effort: Pulmonary effort is normal. No respiratory distress.     Breath sounds: Normal breath sounds. No wheezing or rales.  Musculoskeletal:     Cervical back: Neck supple. No tenderness.     Right lower leg: No edema.     Left lower leg: No edema.  Lymphadenopathy:     Cervical: No cervical adenopathy.  Skin:    General: Skin is warm and dry.     Findings: No rash.  Neurological:     Mental Status: He is alert. Mental status is at baseline.  Psychiatric:        Mood and Affect: Mood normal.            Assessment & Plan:    See Problem List for Assessment and Plan of chronic medical problems.

## 2024-06-03 ENCOUNTER — Encounter: Payer: Self-pay | Admitting: Internal Medicine

## 2024-06-03 ENCOUNTER — Ambulatory Visit: Admitting: Internal Medicine

## 2024-06-03 VITALS — BP 110/72 | HR 62 | Temp 98.0°F | Ht 71.0 in | Wt 139.0 lb

## 2024-06-03 DIAGNOSIS — K219 Gastro-esophageal reflux disease without esophagitis: Secondary | ICD-10-CM | POA: Diagnosis not present

## 2024-06-03 DIAGNOSIS — R42 Dizziness and giddiness: Secondary | ICD-10-CM

## 2024-06-03 NOTE — Assessment & Plan Note (Signed)
 Chronic Intermittent, transient dizziness/lightheadedness Over the years he has complained of this multiple times Typically occurs when he first gets up and starts rushing.  On occasion he will notice it with head movements The symptom is very transient He denies any other associated symptoms such as headaches, palpitations I do not think it is anything serious, but given the chronicity and concern over this will refer to neurology

## 2024-06-03 NOTE — Assessment & Plan Note (Addendum)
 Chronic GERD controlled overall GERD is intermittent but increased Taking pepcid  20 mg daily more often but not daily Continue pepcid  daily prn

## 2024-06-03 NOTE — Patient Instructions (Signed)
      A referral was ordered neurology and someone will call you to schedule an appointment.

## 2024-07-30 ENCOUNTER — Encounter: Admitting: Internal Medicine

## 2024-08-22 ENCOUNTER — Encounter: Payer: Self-pay | Admitting: Internal Medicine

## 2024-08-22 NOTE — Progress Notes (Signed)
 "   Subjective:    Patient ID: Jeremy Ellis, male    DOB: [birth year], 63 y.o.   MRN: [id number]      HPI Jeremy Ellis is here for  Chief Complaint  Patient presents with   Fatigue    Fatigue, gas, bloating and acidic taste    Discussed the use of AI scribe software for clinical note transcription with the patient, who gave verbal consent to proceed.  History of Present Illness Jeremy Ellis is a 63 year old male who presents with gastrointestinal symptoms and concerns about GERD management.  He experiences episodes of inconsistent bowel movements, alternating between liquid and solid, occurring sporadically, typically on Saturdays. These episodes are singular and resolve without additional symptoms. He suspects dietary triggers but has not identified any specific food causing the issue.  He experiences persistent gas and bloating after meals. He uses famotidine  as needed, approximately twice a week, and reports that after taking it, he does not have as much hoarseness.   He describes a vague, non-painful sensation in his chest, likened to a 'tickle in your throat,' occurring occasionally without associated symptoms like shortness of breath or sweating. He is active, frequently using stairs without difficulty.  He experiences dry mouth, particularly in the mornings, and acknowledges he could improve his water intake. He drinks decaffeinated tea and consumes 70% chocolate in moderation.  He has been experiencing fatigue and has been trying to improve his sleep habits by going to bed earlier, which has helped. He occasionally naps, finding it refreshing.  He has resumed going to the gym, focusing on resistance training and adding snacks between meals to garin weight. He reports occasional transient dizziness, which he has experienced before, and has changed footwear to address this.  He is still trying to coordinate an appointment with neurology.  He is considering future  lifestyle changes as he approaches retirement, expressing concerns about maintaining activity and engagement post-retirement.     Medications and allergies reviewed with patient and updated if appropriate.  Current Outpatient Medications on File Prior to Visit  Medication Sig Dispense Refill   Acetaminophen 500 MG coapsule      bifidobacterium infantis (ALIGN) capsule Patient takes one tab two times a month     Cholecalciferol (VITAMIN D3) 400 UNITS CAPS Take by mouth daily.     ECHINACEA PO Take by mouth 2 (two) times daily as needed (immune support). Reported on 11/02/2015     famotidine  (PEPCID ) 20 MG tablet Take 20 mg by mouth daily as needed for heartburn or indigestion.     Multiple Vitamin (MULTIVITAMIN) tablet Take 1 tablet by mouth daily.     OMEGA-3 FATTY ACIDS-VITAMIN E PO      No current facility-administered medications on file prior to visit.    Review of Systems  Constitutional:  Positive for fatigue.  HENT:  Negative for trouble swallowing.   Respiratory:  Negative for shortness of breath.   Cardiovascular:  Negative for chest pain, palpitations and leg swelling.  Gastrointestinal:  Negative for abdominal pain.       Occ gerd.  Gas, bloating. Slight change in bowels  Neurological:  Positive for dizziness (occ - falling sensation when walking) and headaches (occ - mild).  Psychiatric/Behavioral:  Negative for sleep disturbance.        Objective:   Vitals:   08/24/24 0807  BP: 108/60  Pulse: 69  Temp: 98 F (36.7 C)  SpO2: 99%   BP Readings from Last 3  Encounters:  08/24/24 108/60  06/03/24 110/72  01/28/24 112/78   Wt Readings from Last 3 Encounters:  08/24/24 144 lb (65.3 kg)  06/03/24 139 lb (63 kg)  01/28/24 136 lb (61.7 kg)   Body mass index is 20.08 kg/m.    Physical Exam Constitutional:      General: He is not in acute distress.    Appearance: Normal appearance. He is not ill-appearing.  HENT:     Head: Normocephalic and atraumatic.   Eyes:     Conjunctiva/sclera: Conjunctivae normal.  Cardiovascular:     Rate and Rhythm: Normal rate and regular rhythm.     Heart sounds: Normal heart sounds.  Pulmonary:     Effort: Pulmonary effort is normal. No respiratory distress.     Breath sounds: Normal breath sounds. No wheezing or rales.  Musculoskeletal:     Right lower leg: No edema.     Left lower leg: No edema.  Skin:    General: Skin is warm and dry.     Findings: No rash.  Neurological:     Mental Status: He is alert. Mental status is at baseline.  Psychiatric:        Mood and Affect: Mood normal.            Assessment & Plan:    See Problem List for Assessment and Plan of chronic medical problems.    I personally spent a total of 21 minutes in the care of the patient today including getting/reviewing separately obtained history, performing a medically appropriate exam/evaluation, counseling and educating, and documenting clinical information in the EHR.   "

## 2024-08-24 ENCOUNTER — Ambulatory Visit: Admitting: Internal Medicine

## 2024-08-24 VITALS — BP 108/60 | HR 69 | Temp 98.0°F | Ht 71.0 in | Wt 144.0 lb

## 2024-08-24 DIAGNOSIS — R0789 Other chest pain: Secondary | ICD-10-CM | POA: Diagnosis not present

## 2024-08-24 DIAGNOSIS — K219 Gastro-esophageal reflux disease without esophagitis: Secondary | ICD-10-CM

## 2024-08-24 DIAGNOSIS — R5383 Other fatigue: Secondary | ICD-10-CM | POA: Diagnosis not present

## 2024-08-24 NOTE — Patient Instructions (Addendum)
     Medications changes include :   start pepcid  daily fro 2 months and then taper off and try to take as needed     Return if symptoms worsen or fail to improve.

## 2024-08-24 NOTE — Assessment & Plan Note (Signed)
 Acute on chronic Some of the symptoms he is experiencing is likely related to uncontrolled GERD Start taking Pepcid  OTC daily for 2 months-I would expect some of his symptoms to disappear-after 2 months can try tapering it down and taking as needed only if all the symptoms remain controlled

## 2024-08-24 NOTE — Assessment & Plan Note (Signed)
 Chronic Transient Discomfort if-mild in nature Not cardiac Possibly related to GERD-Will see if this resolves with taking the Pepcid  daily

## 2024-08-24 NOTE — Assessment & Plan Note (Signed)
 Improved He has gotten more sleep at night and that has seemed to help.  Continue to make sleep a priority.  Okay to nap if needed

## 2024-09-10 ENCOUNTER — Encounter: Payer: Self-pay | Admitting: Internal Medicine

## 2024-11-08 ENCOUNTER — Encounter: Payer: Self-pay | Admitting: Internal Medicine

## 2024-11-08 NOTE — Progress Notes (Signed)
 "   Subjective:    Patient ID: Jeremy Ellis, male    DOB: 1961-09-07, 63 y.o.   MRN: [id number]     HPI Jeremy Ellis is here for a physical exam and his chronic medical problems.  Every morning has cough and intermittently throughout the day - dry.  Took pepcid  daily x 3 months and it did help.  He takes it prior to eating red sauce.  He does have some postnasal drainage.  Minor headache - takes 500 mg tylenol which helps.   Walking more.  Walks for 45 min.  The first 30 min he is ok then gets jittery and weak.    Has been gaining weight.  Eats some dark chocolate, small amount of juice and a couple of graham crackers.  Tries to avoid sugars.  Getting a mouth guard from his dentist due to teeth grinding.   Medications and allergies reviewed with patient and updated if appropriate.  Medications Ordered Prior to Encounter[1]  Review of Systems  Constitutional:  Negative for fever.  HENT:  Positive for postnasal drip.   Eyes:  Negative for visual disturbance.  Respiratory:  Positive for cough (early morning, dry). Negative for shortness of breath and wheezing.   Cardiovascular:  Positive for chest pain (occ from GERD). Negative for palpitations and leg swelling.  Gastrointestinal:  Negative for abdominal pain, blood in stool, constipation and diarrhea.        gerd  Genitourinary:  Negative for dysuria.  Musculoskeletal:  Negative for arthralgias and back pain.  Skin:  Negative for rash.  Neurological:  Positive for headaches (freq mild). Negative for light-headedness.  Psychiatric/Behavioral:  Negative for dysphoric mood. The patient is not nervous/anxious.        Objective:   Vitals:   11/16/24 0823  BP: 116/78  Pulse: (!) 54  Temp: 97.8 F (36.6 C)  SpO2: 97%   Filed Weights   11/16/24 0823  Weight: 148 lb (67.1 kg)   Body mass index is 20.64 kg/m.  BP Readings from Last 3 Encounters:  11/16/24 116/78  08/24/24 108/60  06/03/24 110/72    Wt Readings from  Last 3 Encounters:  11/16/24 148 lb (67.1 kg)  08/24/24 144 lb (65.3 kg)  06/03/24 139 lb (63 kg)      Physical Exam Constitutional: He appears well-developed and well-nourished. No distress.  HENT:  Head: Normocephalic and atraumatic.  Right Ear: External ear normal.  Left Ear: External ear normal.  Normal ear canals and TM b/l  Mouth/Throat: Oropharynx is clear and moist. Eyes: Conjunctivae and EOM are normal.  Neck: Neck supple. No tracheal deviation present. No thyromegaly present.  No carotid bruit  Cardiovascular: Normal rate, regular rhythm, normal heart sounds and intact distal pulses.   No murmur heard.  No lower extremity edema. Pulmonary/Chest: Effort normal and breath sounds normal. No respiratory distress. He has no wheezes. He has no rales.  Abdominal: Soft. He exhibits no distension. There is no tenderness.  Genitourinary: deferred  Lymphadenopathy:   He has no cervical adenopathy.  Skin: Skin is warm and dry. He is not diaphoretic.  Psychiatric: He has a normal mood and affect. His behavior is normal.    The 10-year ASCVD risk score (Arnett DK, et al., 2019) is: 8.5%   Values used to calculate the score:     Age: 39 years     Clinically relevant sex: Male     Is Non-Hispanic African American: No     Diabetic: No  Tobacco smoker: No     Systolic Blood Pressure: 116 mmHg     Is BP treated: No     HDL Cholesterol: 55.2 mg/dL     Total Cholesterol: 153 mg/dL   EKG: sinus bradycardia @ 59 with sinus arrhythmia, normal EKG.  Bradycardia and sinus arrhythmia are new and mild - no further evaluation needed     Assessment & Plan:   Physical exam: Screening blood work  ordered Exercise   regular - walking, resistance training Weight normal Substance abuse   none   Reviewed recommended immunizations.   Health Maintenance  Topic Date Due   COVID-19 Vaccine (6 - 2025-26 season) 11/24/2024 (Originally 07/12/2024)   Pneumococcal Vaccine: 50+ Years (3 of 3  - PCV20 or PCV21) 02/07/2027   Colonoscopy  11/28/2029   DTaP/Tdap/Td (4 - Td or Tdap) 11/22/2031   Influenza Vaccine  Completed   Hepatitis C Screening  Completed   HIV Screening  Completed   Zoster Vaccines- Shingrix   Completed   Hepatitis B Vaccines 19-59 Average Risk  Aged Out   Meningococcal B Vaccine  Aged Out     See Problem List for Assessment and Plan of chronic medical problems.       [1]  Current Outpatient Medications on File Prior to Visit  Medication Sig Dispense Refill   Acetaminophen 500 MG coapsule      bifidobacterium infantis (ALIGN) capsule Patient takes one tab two times a month     Cholecalciferol (VITAMIN D3) 400 UNITS CAPS Take by mouth daily.     ECHINACEA PO Take by mouth 2 (two) times daily as needed (immune support). Reported on 11/02/2015     famotidine  (PEPCID ) 20 MG tablet Take 20 mg by mouth daily as needed for heartburn or indigestion.     Multiple Vitamin (MULTIVITAMIN) tablet Take 1 tablet by mouth daily.     OMEGA-3 FATTY ACIDS-VITAMIN E PO      No current facility-administered medications on file prior to visit.   "

## 2024-11-08 NOTE — Patient Instructions (Addendum)
 "     Blood work was ordered.       Medications changes include :   None     Return in about 6 months (around 05/16/2025) for follow up.    Health Maintenance, Male Adopting a healthy lifestyle and getting preventive care are important in promoting health and wellness. Ask your health care provider about: The right schedule for you to have regular tests and exams. Things you can do on your own to prevent diseases and keep yourself healthy. What should I know about diet, weight, and exercise? Eat a healthy diet  Eat a diet that includes plenty of vegetables, fruits, low-fat dairy products, and lean protein. Do not eat a lot of foods that are high in solid fats, added sugars, or sodium. Maintain a healthy weight Body mass index (BMI) is a measurement that can be used to identify possible weight problems. It estimates body fat based on height and weight. Your health care provider can help determine your BMI and help you achieve or maintain a healthy weight. Get regular exercise Get regular exercise. This is one of the most important things you can do for your health. Most adults should: Exercise for at least 150 minutes each week. The exercise should increase your heart rate and make you sweat (moderate-intensity exercise). Do strengthening exercises at least twice a week. This is in addition to the moderate-intensity exercise. Spend less time sitting. Even light physical activity can be beneficial. Watch cholesterol and blood lipids Have your blood tested for lipids and cholesterol at 65 years of age, then have this test every 5 years. You may need to have your cholesterol levels checked more often if: Your lipid or cholesterol levels are high. You are older than 65 years of age. You are at high risk for heart disease. What should I know about cancer screening? Many types of cancers can be detected early and may often be prevented. Depending on your health history and family  history, you may need to have cancer screening at various ages. This may include screening for: Colorectal cancer. Prostate cancer. Skin cancer. Lung cancer. What should I know about heart disease, diabetes, and high blood pressure? Blood pressure and heart disease High blood pressure causes heart disease and increases the risk of stroke. This is more likely to develop in people who have high blood pressure readings or are overweight. Talk with your health care provider about your target blood pressure readings. Have your blood pressure checked: Every 3-5 years if you are 62-48 years of age. Every year if you are 87 years old or older. If you are between the ages of 56 and 49 and are a current or former smoker, ask your health care provider if you should have a one-time screening for abdominal aortic aneurysm (AAA). Diabetes Have regular diabetes screenings. This checks your fasting blood sugar level. Have the screening done: Once every three years after age 22 if you are at a normal weight and have a low risk for diabetes. More often and at a younger age if you are overweight or have a high risk for diabetes. What should I know about preventing infection? Hepatitis B If you have a higher risk for hepatitis B, you should be screened for this virus. Talk with your health care provider to find out if you are at risk for hepatitis B infection. Hepatitis C Blood testing is recommended for: Everyone born from [birth year] through 1965. Anyone with known risk factors for hepatitis C.  Sexually transmitted infections (STIs) You should be screened each year for STIs, including gonorrhea and chlamydia, if: You are sexually active and are younger than 65 years of age. You are older than 65 years of age and your health care provider tells you that you are at risk for this type of infection. Your sexual activity has changed since you were last screened, and you are at increased risk for chlamydia or  gonorrhea. Ask your health care provider if you are at risk. Ask your health care provider about whether you are at high risk for HIV. Your health care provider may recommend a prescription medicine to help prevent HIV infection. If you choose to take medicine to prevent HIV, you should first get tested for HIV. You should then be tested every 3 months for as long as you are taking the medicine. Follow these instructions at home: Alcohol use Do not drink alcohol if your health care provider tells you not to drink. If you drink alcohol: Limit how much you have to 0-2 drinks a day. Know how much alcohol is in your drink. In the [place] one drink equals one 12 oz bottle of beer (355 mL), one 5 oz glass of wine (148 mL), or one 1 oz glass of hard liquor (44 mL). Lifestyle Do not use any products that contain nicotine or tobacco. These products include cigarettes, chewing tobacco, and vaping devices, such as e-cigarettes. If you need help quitting, ask your health care provider. Do not use street drugs. Do not share needles. Ask your health care provider for help if you need support or information about quitting drugs. General instructions Schedule regular health, dental, and eye exams. Stay current with your vaccines. Tell your health care provider if: You often feel depressed. You have ever been abused or do not feel safe at home. Summary Adopting a healthy lifestyle and getting preventive care are important in promoting health and wellness. Follow your health care provider's instructions about healthy diet, exercising, and getting tested or screened for diseases. Follow your health care provider's instructions on monitoring your cholesterol and blood pressure. This information is not intended to replace advice given to you by your health care provider. Make sure you discuss any questions you have with your health care provider. Document Revised: 03/19/2021 Document Reviewed: 03/19/2021 Elsevier  Patient Education  2024 [employer]. "

## 2024-11-16 ENCOUNTER — Ambulatory Visit: Admitting: Internal Medicine

## 2024-11-16 VITALS — BP 116/78 | HR 54 | Temp 97.8°F | Ht 71.0 in | Wt 148.0 lb

## 2024-11-16 DIAGNOSIS — Z0000 Encounter for general adult medical examination without abnormal findings: Secondary | ICD-10-CM | POA: Diagnosis not present

## 2024-11-16 DIAGNOSIS — K219 Gastro-esophageal reflux disease without esophagitis: Secondary | ICD-10-CM

## 2024-11-16 DIAGNOSIS — Z125 Encounter for screening for malignant neoplasm of prostate: Secondary | ICD-10-CM

## 2024-11-16 DIAGNOSIS — R7303 Prediabetes: Secondary | ICD-10-CM

## 2024-11-16 DIAGNOSIS — E7800 Pure hypercholesterolemia, unspecified: Secondary | ICD-10-CM

## 2024-11-16 DIAGNOSIS — Z0184 Encounter for antibody response examination: Secondary | ICD-10-CM

## 2024-11-16 NOTE — Assessment & Plan Note (Signed)
 Chronic Family history of diabetes Lab Results  Component Value Date   HGBA1C 6.1 01/28/2024   Check a1c Eating a Low sugar / carb diet Continue regular exercise

## 2024-11-16 NOTE — Assessment & Plan Note (Addendum)
 Chronic Regular exercise and healthy diet encouraged Check lipid panel, CMP, CBC, TSH Continue lifestyle control CT CAC 0 in 2022, ASCVD risk 8.5% EKG today

## 2024-11-16 NOTE — Assessment & Plan Note (Addendum)
 Chronic Controlled Continue Pepcid  OTC daily for almost 3 months which has improved his cough and GERD symptoms-Will try to taper off slowly and take as needed

## 2024-11-17 LAB — CBC
Hematocrit: 44.2 % (ref 37.5–51.0)
Hemoglobin: 14.4 g/dL (ref 13.0–17.7)
MCH: 33.4 pg — ABNORMAL HIGH (ref 26.6–33.0)
MCHC: 32.6 g/dL (ref 31.5–35.7)
MCV: 103 fL — ABNORMAL HIGH (ref 79–97)
Platelets: 172 x10E3/uL (ref 150–450)
RBC: 4.31 x10E6/uL (ref 4.14–5.80)
RDW: 12.2 % (ref 11.6–15.4)
WBC: 4.3 x10E3/uL (ref 3.4–10.8)

## 2024-11-17 LAB — COMPREHENSIVE METABOLIC PANEL WITH GFR
ALT: 21 IU/L (ref 0–44)
AST: 27 IU/L (ref 0–40)
Albumin: 4.7 g/dL (ref 3.9–4.9)
Alkaline Phosphatase: 62 IU/L (ref 47–123)
BUN/Creatinine Ratio: 21 (ref 10–24)
BUN: 20 mg/dL (ref 8–27)
Bilirubin Total: 0.6 mg/dL (ref 0.0–1.2)
CO2: 27 mmol/L (ref 20–29)
Calcium: 9.8 mg/dL (ref 8.6–10.2)
Chloride: 100 mmol/L (ref 96–106)
Creatinine, Ser: 0.94 mg/dL (ref 0.76–1.27)
Globulin, Total: 2.3 g/dL (ref 1.5–4.5)
Glucose: 92 mg/dL (ref 70–99)
Potassium: 4.3 mmol/L (ref 3.5–5.2)
Sodium: 139 mmol/L (ref 134–144)
Total Protein: 7 g/dL (ref 6.0–8.5)
eGFR: 90 mL/min/1.73

## 2024-11-17 LAB — LIPID PANEL
Chol/HDL Ratio: 2.8 ratio (ref 0.0–5.0)
Cholesterol, Total: 166 mg/dL (ref 100–199)
HDL: 60 mg/dL
LDL Chol Calc (NIH): 96 mg/dL (ref 0–99)
Triglycerides: 51 mg/dL (ref 0–149)
VLDL Cholesterol Cal: 10 mg/dL (ref 5–40)

## 2024-11-17 LAB — TSH: TSH: 2.97 u[IU]/mL (ref 0.450–4.500)

## 2024-11-17 LAB — HEMOGLOBIN A1C
Est. average glucose Bld gHb Est-mCnc: 123 mg/dL
Hgb A1c MFr Bld: 5.9 % — ABNORMAL HIGH (ref 4.8–5.6)

## 2024-11-17 LAB — MEASLES/MUMPS/RUBELLA IMMUNITY
MUMPS ABS, IGG: >300 [AU]/ml
RUBEOLA AB, IGG: >300 [AU]/ml
Rubella Antibodies, IGG: 19.9 {index}

## 2024-11-18 ENCOUNTER — Ambulatory Visit: Payer: Self-pay | Admitting: Internal Medicine

## 2024-12-06 ENCOUNTER — Encounter: Payer: Self-pay | Admitting: Internal Medicine

## 2024-12-07 ENCOUNTER — Encounter: Payer: Self-pay | Admitting: Internal Medicine

## 2024-12-07 ENCOUNTER — Ambulatory Visit: Admitting: Internal Medicine

## 2024-12-07 VITALS — BP 108/78 | HR 89 | Temp 98.4°F | Ht 71.0 in | Wt 146.0 lb

## 2024-12-07 DIAGNOSIS — J069 Acute upper respiratory infection, unspecified: Secondary | ICD-10-CM | POA: Diagnosis not present

## 2024-12-07 DIAGNOSIS — K219 Gastro-esophageal reflux disease without esophagitis: Secondary | ICD-10-CM | POA: Diagnosis not present

## 2024-12-07 NOTE — Progress Notes (Signed)
 "   Subjective:    Patient ID: Jeremy Ellis, male    DOB: Jun 13, 1961, 64 y.o.   MRN: [id number]      HPI Jeremy Ellis is here for  Chief Complaint  Patient presents with   Cough    Productive cough, scratchy throat started on Saturday; Sneezing, runny nose; fatigue     Discussed the use of AI scribe software for clinical note transcription with the patient, who gave verbal consent to proceed.  History of Present Illness Jeremy Ellis is a 64 year old male who presents with upper respiratory symptoms including a scratchy throat and cough.  His symptoms began on Saturday with a scratchy throat, initially thought to be due to reflux, and he attempted to treat it with Pepcid  AC without relief. By Sunday morning, the scratchy throat worsened, and he began coughing up clear material, which has since turned to a yellowish-clear consistency. He also experiences sneezing and nasal congestion.  He took his temperature but is unsure of its accuracy due to sleeping under an electric blanket. He tested negative for COVID-19 and influenza A and B with an over-the-counter test on Monday, and he uploaded the test image for review.  He has been taking Tylenol for a minor sinus headache located behind his eye and reports feeling less energetic than usual.  His appetite has decreased; he ate a cheese sandwich, banana, and applesauce but did not eat dinner last night. He describes feeling 'not really hungry' and notes that food does not taste right. He also reports a dry mouth.  No ear pain, facial pain, shortness of breath, wheezing, body aches, nausea, and diarrhea. He experiences a dull headache and occasional dizziness when bending over. He reports chills, which he attributes to the cold weather and possible low-grade fevers.  He has been taking Tylenol 500 mg and Pepcid  AC, and he drinks green tea to soothe his throat.      Medications and allergies reviewed with patient and updated  if appropriate.  Medications Ordered Prior to Encounter[1]  Review of Systems  Constitutional:  Positive for appetite change, fatigue (mild) and fever (low grade).  HENT:  Positive for congestion, rhinorrhea, sinus pressure (mild) and sore throat (scratchy). Negative for ear pain.   Respiratory:  Positive for cough (yellow  - clear phlegm). Negative for shortness of breath and wheezing.   Gastrointestinal:  Negative for diarrhea and nausea.  Musculoskeletal:  Negative for myalgias.  Neurological:  Positive for light-headedness (mild) and headaches (mild). Negative for dizziness.       Objective:   Vitals:   12/07/24 1338  BP: 108/78  Pulse: 89  Temp: 98.4 F (36.9 C)  SpO2: 97%   BP Readings from Last 3 Encounters:  12/07/24 108/78  11/16/24 116/78  08/24/24 108/60   Wt Readings from Last 3 Encounters:  12/07/24 146 lb (66.2 kg)  11/16/24 148 lb (67.1 kg)  08/24/24 144 lb (65.3 kg)   Body mass index is 20.36 kg/m.    Physical Exam Constitutional:      General: He is not in acute distress.    Appearance: Normal appearance. He is not ill-appearing.  HENT:     Head: Normocephalic.     Right Ear: Tympanic membrane, ear canal and external ear normal. There is no impacted cerumen.     Left Ear: Tympanic membrane, ear canal and external ear normal. There is no impacted cerumen.     Mouth/Throat:     Mouth: Mucous membranes  are moist.     Pharynx: No oropharyngeal exudate or posterior oropharyngeal erythema.  Eyes:     Conjunctiva/sclera: Conjunctivae normal.  Cardiovascular:     Rate and Rhythm: Normal rate and regular rhythm.  Pulmonary:     Effort: Pulmonary effort is normal. No respiratory distress.     Breath sounds: Normal breath sounds. No wheezing or rales.  Musculoskeletal:     Cervical back: Neck supple. No tenderness.  Lymphadenopathy:     Cervical: No cervical adenopathy.  Skin:    General: Skin is warm and dry.     Findings: No rash.  Neurological:      Mental Status: He is alert.            Assessment & Plan:    See Problem List for Assessment and Plan of chronic medical problems.   Assessment and Plan Assessment & Plan Acute viral upper respiratory infection Symptoms consistent with viral URI, likely adenovirus. Negative COVID and influenza tests. No fever or significant body aches. - Continue symptomatic treatment with OTC medications: cough suppressants, nasal sprays, allergy medications as needed. - Encouraged rest and increased fluid intake. - Advised eating when hungry, focus on hydration and light meals if appetite reduced. - Recommended mask-wearing and distancing if returning to work. - Instructed to monitor symptoms and return if condition worsens or does not improve.  Gastroesophageal reflux disease GERD symptoms managed with famotidine . - Continue famotidine  as needed for GERD symptoms.        [1]  Current Outpatient Medications on File Prior to Visit  Medication Sig Dispense Refill   Acetaminophen 500 MG coapsule      bifidobacterium infantis (ALIGN) capsule Patient takes one tab two times a month     Cholecalciferol (VITAMIN D3) 400 UNITS CAPS Take by mouth daily.     ECHINACEA PO Take by mouth 2 (two) times daily as needed (immune support). Reported on 11/02/2015     famotidine  (PEPCID ) 20 MG tablet Take 20 mg by mouth daily as needed for heartburn or indigestion.     Multiple Vitamin (MULTIVITAMIN) tablet Take 1 tablet by mouth daily.     OMEGA-3 FATTY ACIDS-VITAMIN E PO      No current facility-administered medications on file prior to visit.   "

## 2024-12-07 NOTE — Patient Instructions (Addendum)
" ° ° °  You likely have a viral cold.        Medications changes include :   None      Return if symptoms worsen or fail to improve.  "

## 2024-12-07 NOTE — Assessment & Plan Note (Signed)
 Chronic Controlled Continue Pepcid  20 mg daily as needed-take daily if asymptomatic and then go back to only as needed
